# Patient Record
Sex: Female | Born: 1955 | Race: White | Hispanic: No | Smoking: Never smoker
Health system: Southern US, Community
[De-identification: ages and names within clinical notes are randomized; demographics above are authoritative.]

## PROBLEM LIST (undated history)

## (undated) DIAGNOSIS — N390 Urinary tract infection, site not specified: Secondary | ICD-10-CM

## (undated) DIAGNOSIS — F419 Anxiety disorder, unspecified: Secondary | ICD-10-CM

## (undated) DIAGNOSIS — F32A Depression, unspecified: Secondary | ICD-10-CM

## (undated) DIAGNOSIS — F329 Major depressive disorder, single episode, unspecified: Secondary | ICD-10-CM

## (undated) HISTORY — PX: HIATAL HERNIA REPAIR: SHX195

## (undated) HISTORY — PX: KNEE SURGERY: SHX244

## (undated) HISTORY — PX: FOOT SURGERY: SHX648

## (undated) HISTORY — PX: HERNIA REPAIR: SHX51

## (undated) HISTORY — PX: STOMACH SURGERY: SHX791

## (undated) HISTORY — PX: TONSILLECTOMY: SUR1361

---

## 2012-07-23 ENCOUNTER — Emergency Department (HOSPITAL_BASED_OUTPATIENT_CLINIC_OR_DEPARTMENT_OTHER)
Admission: EM | Admit: 2012-07-23 | Discharge: 2012-07-24 | Disposition: A | Discharge status: Home / Self Care | Payer: No Typology Code available for payment source | Attending: Emergency Medicine | Admitting: Emergency Medicine

## 2012-07-23 ENCOUNTER — Encounter (HOSPITAL_BASED_OUTPATIENT_CLINIC_OR_DEPARTMENT_OTHER): Payer: Self-pay | Admitting: *Deleted

## 2012-07-23 ENCOUNTER — Emergency Department (HOSPITAL_BASED_OUTPATIENT_CLINIC_OR_DEPARTMENT_OTHER): Payer: No Typology Code available for payment source

## 2012-07-23 DIAGNOSIS — N390 Urinary tract infection, site not specified: Secondary | ICD-10-CM | POA: Insufficient documentation

## 2012-07-23 DIAGNOSIS — F3289 Other specified depressive episodes: Secondary | ICD-10-CM | POA: Insufficient documentation

## 2012-07-23 DIAGNOSIS — F329 Major depressive disorder, single episode, unspecified: Secondary | ICD-10-CM | POA: Insufficient documentation

## 2012-07-23 DIAGNOSIS — F411 Generalized anxiety disorder: Secondary | ICD-10-CM | POA: Insufficient documentation

## 2012-07-23 DIAGNOSIS — R519 Headache, unspecified: Secondary | ICD-10-CM

## 2012-07-23 DIAGNOSIS — Z79899 Other long term (current) drug therapy: Secondary | ICD-10-CM | POA: Insufficient documentation

## 2012-07-23 DIAGNOSIS — R1310 Dysphagia, unspecified: Secondary | ICD-10-CM | POA: Insufficient documentation

## 2012-07-23 HISTORY — DX: Major depressive disorder, single episode, unspecified: F32.9

## 2012-07-23 HISTORY — DX: Depression, unspecified: F32.A

## 2012-07-23 HISTORY — DX: Anxiety disorder, unspecified: F41.9

## 2012-07-23 MED ORDER — SODIUM CHLORIDE 0.9 % IV SOLN
Freq: Once | INTRAVENOUS | Status: AC
  Administered 2012-07-24: 01:00:00 via INTRAVENOUS

## 2012-07-23 NOTE — ED Notes (Signed)
Family at bedside. 

## 2012-07-23 NOTE — ED Provider Notes (Addendum)
History     CSN: [id number]  Arrival date & time 07/23/12  2131   First MD Initiated Contact with Patient 07/23/12 2319      Chief Complaint  Patient presents with  . Headache    (Consider location/radiation/quality/duration/timing/severity/associated sxs/prior treatment) HPI This is a 56 year old female with a chief complaint of headache. She states a week ago she struck her head "pretty hard" against an object. She did not lose consciousness. For the past week she has been having trouble swallowing pills. She has been having to cut them in pieces to prevent her from gagging. She had not been having head pain following her injury until this morning. It began as mild pain in the occipital region which is worsened to about 8/10. The pain was well localized and worse with palpation but not with movement of the neck. It had been associated with worsening dysphagia and a subjective sensation of swelling in her throat. About 6 PM she developed a generalized tremor which lasted several hours. The tremor was severe enough to prevent her from performing simple tasks such as preparing dinner. The tremor has now resolved. She denies dysuria, fever, chest pain, dyspnea, nausea, vomiting, diarrhea or constipation.  Past Medical History  Diagnosis Date  . Anxiety   . Depression     Past Surgical History  Procedure Date  . Tonsillectomy   . Foot surgery   . Knee surgery     No family history on file.  History  Substance Use Topics  . Smoking status: Never Smoker   . Smokeless tobacco: Not on file  . Alcohol Use: No    OB History    Grav Para Term Preterm Abortions TAB SAB Ect Mult Living                  Review of Systems  All other systems reviewed and are negative.    Allergies  Codeine  Home Medications   Current Outpatient Rx  Name  Route  Sig  Dispense  Refill  . SERTRALINE HCL 50 MG PO TABS   Oral   Take 50 mg by mouth daily.         Marland Kitchen SIMVASTATIN 40 MG PO  TABS   Oral   Take 40 mg by mouth every evening.         . TRAZODONE HCL 100 MG PO TABS   Oral   Take 100 mg by mouth at bedtime.         . VENLAFAXINE HCL ER 150 MG PO CP24   Oral   Take 150 mg by mouth daily.           BP 129/89  Pulse 101  Temp 97.8 F (36.6 C) (Oral)  Resp 24  SpO2 96%  Physical Exam General: Well-developed, well-nourished female in no acute distress; appearance consistent with age of record HENT: normocephalic, atraumatic; no pharyngeal erythema, exudate or edema; no dysphonia; occipital scalp tenderness and about the origin of the greater occipital nerves Eyes: pupils equal round and reactive to light; extraocular muscles intact Neck: supple Heart: regular rate and rhythm Lungs: clear to auscultation bilaterally Abdomen: soft; nondistended; nontender; bowel sounds present Extremities: No deformity; full range of motion; pulses normal Neurologic: Awake, alert and oriented; motor function intact in all extremities and symmetric; no facial droop; normal coordination speech; negative Romberg; normal finger to nose Skin: Warm and dry Psychiatric: Flat affect    ED Course  Procedures (including critical care time)  MDM   Nursing notes and vitals signs, including pulse oximetry, reviewed.  Summary of this visit's results, reviewed by myself:  Labs:  Results for orders placed during the hospital encounter of 07/23/12  URINALYSIS, ROUTINE W REFLEX MICROSCOPIC      Component Value Range   Color, Urine YELLOW  YELLOW   APPearance CLOUDY (*) CLEAR   Specific Gravity, Urine 1.027  1.005 - 1.030   pH 6.5  5.0 - 8.0   Glucose, UA NEGATIVE  NEGATIVE mg/dL   Hgb urine dipstick NEGATIVE  NEGATIVE   Bilirubin Urine NEGATIVE  NEGATIVE   Ketones, ur NEGATIVE  NEGATIVE mg/dL   Protein, ur NEGATIVE  NEGATIVE mg/dL   Urobilinogen, UA 1.0  0.0 - 1.0 mg/dL   Nitrite NEGATIVE  NEGATIVE   Leukocytes, UA MODERATE (*) NEGATIVE  CBC WITH DIFFERENTIAL       Component Value Range   WBC 7.6  4.0 - 10.5 K/uL   RBC 4.28  3.87 - 5.11 MIL/uL   Hemoglobin 12.8  12.0 - 15.0 g/dL   HCT 16.1  09.6 - 04.5 %   MCV 90.4  78.0 - 100.0 fL   MCH 29.9  26.0 - 34.0 pg   MCHC 33.1  30.0 - 36.0 g/dL   RDW 40.9  81.1 - 91.4 %   Platelets 276  150 - 400 K/uL   Neutrophils Relative 62  43 - 77 %   Neutro Abs 4.7  1.7 - 7.7 K/uL   Lymphocytes Relative 28  12 - 46 %   Lymphs Abs 2.2  0.7 - 4.0 K/uL   Monocytes Relative 8  3 - 12 %   Monocytes Absolute 0.6  0.1 - 1.0 K/uL   Eosinophils Relative 1  0 - 5 %   Eosinophils Absolute 0.1  0.0 - 0.7 K/uL   Basophils Relative 0  0 - 1 %   Basophils Absolute 0.0  0.0 - 0.1 K/uL  BASIC METABOLIC PANEL      Component Value Range   Sodium 143  135 - 145 mEq/L   Potassium 3.8  3.5 - 5.1 mEq/L   Chloride 105  96 - 112 mEq/L   CO2 28  19 - 32 mEq/L   Glucose, Bld 104 (*) 70 - 99 mg/dL   BUN 17  6 - 23 mg/dL   Creatinine, Ser 7.82  0.50 - 1.10 mg/dL   Calcium 9.2  8.4 - 95.6 mg/dL   GFR calc non Af Amer 80 (*) >90 mL/min   GFR calc Af Amer >90  >90 mL/min  URINE MICROSCOPIC-ADD ON      Component Value Range   Squamous Epithelial / LPF FEW (*) RARE   WBC, UA 21-50  <3 WBC/hpf   RBC / HPF 0-2  <3 RBC/hpf   Bacteria, UA MANY (*) RARE   Urine-Other MUCOUS PRESENT      Imaging Studies: Ct Head Wo Contrast  07/24/2012  *RADIOLOGY REPORT*  Clinical Data: Patient struck the back of the head 1 week ago, persistent headache.  Bilateral hand tremors.  Dysphagia.  CT HEAD WITHOUT CONTRAST  Technique:  Contiguous axial images were obtained from the base of the skull through the vertex without contrast.  Comparison: None.  Findings: Ventricular system normal in size and appearance for age. No mass lesion.  No midline shift.  No acute hemorrhage or hematoma.  No extra-axial fluid collections.  No evidence of acute infarction.  No focal brain parenchymal abnormality.  Mucosal thickening  involving the right maxillary sinus.   Remaining visualized paranasal sinuses, bilateral mastoid air cells, and bilateral middle ear cavities well-aerated.  Mild bilateral carotid siphon atherosclerosis.  IMPRESSION:  1.  No acute intracranial abnormality. 2.  Mild chronic right maxillary sinusitis.   Original Report Authenticated By: Hulan Saas, M.D.    12:39 AM The patient passed her bedside swallowing study. She characterizes her dysphagia is a sensation something is getting stuck on the way down. Will arrange for an outpatient barium swallow which her primary care physician can followup on.  Her shaking episodes likely due to a urinary tract infection. We will treat her with antibiotics for this.  Her headache on exam is consistent with occipital neuralgia, likely a sequela of her recent occipital injury. She does not wish any prescriptions for analgesic.          Hanley Seamen, MD 07/24/12 0040  Hanley Seamen, MD 07/24/12 1610

## 2012-07-23 NOTE — ED Notes (Signed)
Pt. Reports she is unable to urinate and wants something to drink.  Explained that will have to be cleared by the EDP.

## 2012-07-23 NOTE — ED Notes (Signed)
Pain in the back of her head. Has been feeling shaky all day. States her throat is swelling. She is speaking in complete sentences. No respiratory distress at triage.

## 2012-07-24 LAB — URINALYSIS, ROUTINE W REFLEX MICROSCOPIC
Nitrite: NEGATIVE
Protein, ur: NEGATIVE mg/dL
Urobilinogen, UA: 1 mg/dL (ref 0.0–1.0)

## 2012-07-24 LAB — CBC WITH DIFFERENTIAL/PLATELET
Basophils Absolute: 0 10*3/uL (ref 0.0–0.1)
Basophils Relative: 0 % (ref 0–1)
Lymphocytes Relative: 28 % (ref 12–46)
MCHC: 33.1 g/dL (ref 30.0–36.0)
Neutro Abs: 4.7 10*3/uL (ref 1.7–7.7)
Platelets: 276 10*3/uL (ref 150–400)
RDW: 14.5 % (ref 11.5–15.5)
WBC: 7.6 10*3/uL (ref 4.0–10.5)

## 2012-07-24 LAB — URINE MICROSCOPIC-ADD ON

## 2012-07-24 LAB — BASIC METABOLIC PANEL
CO2: 28 mEq/L (ref 19–32)
Calcium: 9.2 mg/dL (ref 8.4–10.5)
Chloride: 105 mEq/L (ref 96–112)
GFR calc Af Amer: >90 mL/min (ref >90)
Sodium: 143 mEq/L (ref 135–145)

## 2012-07-24 MED ORDER — NITROFURANTOIN MONOHYD MACRO 100 MG PO CAPS
100.0000 mg | ORAL_CAPSULE | Freq: Once | ORAL | Status: AC
  Administered 2012-07-24: 100 mg via ORAL
  Filled 2012-07-24: qty 1

## 2012-07-24 MED ORDER — NITROFURANTOIN MONOHYD MACRO 100 MG PO CAPS: 100.0000 mg | ORAL_CAPSULE | Freq: Two times a day (BID) | ORAL | Status: AC

## 2012-07-24 NOTE — ED Notes (Signed)
Pt. Walked stedy gait to restroom and no trouble with urination.

## 2012-07-25 LAB — URINE CULTURE: Colony Count: NO GROWTH

## 2013-05-30 ENCOUNTER — Emergency Department (HOSPITAL_BASED_OUTPATIENT_CLINIC_OR_DEPARTMENT_OTHER): Payer: BC Managed Care – PPO

## 2013-05-30 ENCOUNTER — Encounter (HOSPITAL_BASED_OUTPATIENT_CLINIC_OR_DEPARTMENT_OTHER): Payer: Self-pay | Admitting: *Deleted

## 2013-05-30 ENCOUNTER — Emergency Department (HOSPITAL_BASED_OUTPATIENT_CLINIC_OR_DEPARTMENT_OTHER)
Admission: EM | Admit: 2013-05-30 | Discharge: 2013-05-30 | Payer: BC Managed Care – PPO | Attending: Emergency Medicine | Admitting: Emergency Medicine

## 2013-05-30 DIAGNOSIS — R109 Unspecified abdominal pain: Secondary | ICD-10-CM | POA: Insufficient documentation

## 2013-05-30 DIAGNOSIS — F329 Major depressive disorder, single episode, unspecified: Secondary | ICD-10-CM | POA: Insufficient documentation

## 2013-05-30 DIAGNOSIS — I209 Angina pectoris, unspecified: Secondary | ICD-10-CM | POA: Insufficient documentation

## 2013-05-30 DIAGNOSIS — R079 Chest pain, unspecified: Secondary | ICD-10-CM | POA: Insufficient documentation

## 2013-05-30 DIAGNOSIS — F3289 Other specified depressive episodes: Secondary | ICD-10-CM | POA: Insufficient documentation

## 2013-05-30 DIAGNOSIS — Z87440 Personal history of urinary (tract) infections: Secondary | ICD-10-CM | POA: Insufficient documentation

## 2013-05-30 DIAGNOSIS — R0602 Shortness of breath: Secondary | ICD-10-CM | POA: Insufficient documentation

## 2013-05-30 DIAGNOSIS — F411 Generalized anxiety disorder: Secondary | ICD-10-CM | POA: Insufficient documentation

## 2013-05-30 DIAGNOSIS — I200 Unstable angina: Secondary | ICD-10-CM

## 2013-05-30 DIAGNOSIS — Z79899 Other long term (current) drug therapy: Secondary | ICD-10-CM | POA: Insufficient documentation

## 2013-05-30 HISTORY — DX: Urinary tract infection, site not specified: N39.0

## 2013-05-30 LAB — URINALYSIS, ROUTINE W REFLEX MICROSCOPIC
Bilirubin Urine: NEGATIVE
Nitrite: NEGATIVE
Specific Gravity, Urine: 1.011 (ref 1.005–1.030)
Urobilinogen, UA: 0.2 mg/dL (ref 0.0–1.0)
pH: 6 (ref 5.0–8.0)

## 2013-05-30 LAB — BASIC METABOLIC PANEL
CO2: 26 mEq/L (ref 19–32)
Chloride: 104 mEq/L (ref 96–112)
Creatinine, Ser: 1 mg/dL (ref 0.50–1.10)
GFR calc Af Amer: 71 mL/min — ABNORMAL LOW (ref >90)
Potassium: 3.6 mEq/L (ref 3.5–5.1)

## 2013-05-30 LAB — CBC
MCV: 91.1 fL (ref 78.0–100.0)
Platelets: 264 10*3/uL (ref 150–400)
RBC: 4.48 MIL/uL (ref 3.87–5.11)
RDW: 14.1 % (ref 11.5–15.5)
WBC: 7.3 10*3/uL (ref 4.0–10.5)

## 2013-05-30 LAB — URINE MICROSCOPIC-ADD ON

## 2013-05-30 LAB — TROPONIN I: Troponin I: <0.3 ng/mL (ref <0.30)

## 2013-05-30 MED ORDER — NITROGLYCERIN 0.4 MG SL SUBL
0.4000 mg | SUBLINGUAL_TABLET | SUBLINGUAL | Status: AC | PRN
  Administered 2013-05-30 (×3): 0.4 mg via SUBLINGUAL
  Filled 2013-05-30: qty 25

## 2013-05-30 MED ORDER — NITROGLYCERIN 2 % TD OINT
1.0000 [in_us] | TOPICAL_OINTMENT | Freq: Once | TRANSDERMAL | Status: AC
  Administered 2013-05-30: 1 [in_us] via TOPICAL

## 2013-05-30 MED ORDER — ASPIRIN 325 MG PO TABS
325.0000 mg | ORAL_TABLET | Freq: Once | ORAL | Status: AC
  Administered 2013-05-30: 325 mg via ORAL
  Filled 2013-05-30: qty 1

## 2013-05-30 MED ORDER — NITROGLYCERIN 2 % TD OINT
TOPICAL_OINTMENT | TRANSDERMAL | Status: AC
  Administered 2013-05-30: 1 [in_us] via TOPICAL
  Filled 2013-05-30: qty 1

## 2013-05-30 MED ORDER — MORPHINE SULFATE 4 MG/ML IJ SOLN: 4.0000 mg | Freq: Once | INTRAMUSCULAR | Status: DC

## 2013-05-30 NOTE — ED Notes (Addendum)
Pt. States around 5pm she started having left jaw pain, teeth and left shoulder pain that radiated into her ant chest. Describes as "tight" and aching. States pain is constant. States she feels a little sob. 02 placed at 2l via n/c. Denies any n/v. Denies any heart history. Denies any diaphoresis . Also c/o of mid abd pain. States pain comes and goes. Denies fevers. Denies diarrhea. States pain is a stabbing type pain

## 2013-05-30 NOTE — ED Provider Notes (Signed)
CSN: [id number]     Arrival date & time 05/30/13  2019 History   This chart was scribed for Dagmar Hait, MD by Joaquin Music, ED Scribe. This patient was seen in room MH11/MH11 and the patient's care was started at 9:20 PM     Chief Complaint  Patient presents with  . Chest Pain   Patient is a 57 y.o. female presenting with chest pain. The history is provided by the patient. No language interpreter was used.  Chest Pain Pain location:  Substernal area Pain radiates to the back: no   Pain severity:  Moderate Timing:  Constant Progression:  Unchanged Chronicity:  New Context: at rest   Relieved by:  Nothing Exacerbated by: walking. Ineffective treatments:  None tried Associated symptoms: abdominal pain and shortness of breath   Associated symptoms: no headache, no nausea and not vomiting    HPI Comments: Brenda Morales is a 57 y.o. female who presents to the Emergency Department complaining of left jaw and left shoulder pain that progressed to substernal chest pain earlier this afternoon. She has associated SOB and mild abdominal pain. She rates her current chest pain as 5/10.  She states walking worsens the pain and indicated nothing makes it better.  Pt states she has never had this pain previously. Pt has not taken ASA or nitro PTA. Marland Kitchen Pt denies nausea, emesis,HA, blurred vision, leg swelling. She denies a personal or family cardiac history. She denies estrogen products. Pt denies smoking and alcohol use.Pt has history of a hernia and frequent UTI.  Past Medical History  Diagnosis Date  . Anxiety   . Depression   . UTI (lower urinary tract infection)    Past Surgical History  Procedure Laterality Date  . Tonsillectomy    . Foot surgery    . Knee surgery     No family history on file. History  Substance Use Topics  . Smoking status: Never Smoker   . Smokeless tobacco: Not on file  . Alcohol Use: No   OB History   Grav Para Term Preterm Abortions TAB  SAB Ect Mult Living                 Review of Systems  Eyes: Negative for visual disturbance.  Respiratory: Positive for shortness of breath.   Cardiovascular: Positive for chest pain. Negative for leg swelling.  Gastrointestinal: Positive for abdominal pain. Negative for nausea and vomiting.  Neurological: Negative for headaches.  All other systems reviewed and are negative.    Allergies  Codeine  Home Medications   Current Outpatient Rx  Name  Route  Sig  Dispense  Refill  . diazepam (VALIUM) 5 MG tablet   Oral   Take 5 mg by mouth 2 (two) times daily.         . nitrofurantoin, macrocrystal-monohydrate, (MACROBID) 100 MG capsule   Oral   Take 1 capsule (100 mg total) by mouth 2 (two) times daily.   14 capsule   0   . sertraline (ZOLOFT) 50 MG tablet   Oral   Take 50 mg by mouth daily.         . simvastatin (ZOCOR) 40 MG tablet   Oral   Take 40 mg by mouth every evening.         . traZODone (DESYREL) 100 MG tablet   Oral   Take 100 mg by mouth at bedtime.         Marland Kitchen venlafaxine XR (EFFEXOR-XR) 150 MG 24  hr capsule   Oral   Take 150 mg by mouth daily.          BP 146/89  Pulse 97  Temp(Src) 98 F (36.7 C) (Oral)  Resp 20  Ht 5' 6.5" (1.689 m)  Wt 198 lb (89.812 kg)  BMI 31.48 kg/m2  SpO2 100% Physical Exam  Nursing note and vitals reviewed. Constitutional: She is oriented to person, place, and time. She appears well-developed and well-nourished. No distress.  HENT:  Head: Normocephalic and atraumatic.  Eyes: EOM are normal.  Neck: Neck supple. No tracheal deviation present.  Cardiovascular: Normal rate, regular rhythm and normal heart sounds.   Pulmonary/Chest: Effort normal and breath sounds normal. No respiratory distress.  Abdominal: Soft. She exhibits no distension. There is no tenderness. There is no rebound and no guarding.  Musculoskeletal: Normal range of motion. She exhibits no edema.  Neurological: She is alert and oriented to  person, place, and time.  Skin: Skin is warm and dry.  Psychiatric: She has a normal mood and affect. Her behavior is normal.    ED Course  Procedures  DIAGNOSTIC STUDIES: Oxygen Saturation is 98% on RA, normal by my interpretation.    COORDINATION OF CARE: 9:29 PM-Discussed treatment plan  with pt at bedside and pt agreed to plan.   Labs Review Labs Reviewed  BASIC METABOLIC PANEL - Abnormal; Notable for the following:    Glucose, Bld 149 (*)    GFR calc non Af Amer 61 (*)    GFR calc Af Amer 71 (*)    All other components within normal limits  CBC  TROPONIN I  URINALYSIS, ROUTINE W REFLEX MICROSCOPIC   Imaging Review Dg Chest Port 1 View  05/30/2013   *RADIOLOGY REPORT*  Clinical Data: Chest pain  PORTABLE CHEST - 1 VIEW  Comparison: None.  Findings: Large hiatal hernia is noted.  Otherwise cardiomediastinal silhouette appears normal.  No acute pulmonary disease is noted.  No pleural effusion or pneumothorax is noted.  IMPRESSION: Large hiatal hernia.  No other abnormality seen in the chest.   Original Report Authenticated By: Lupita Raider.,  M.D.    Date: 05/30/2013  Rate: 93  Rhythm: normal sinus rhythm  QRS Axis: normal  Intervals: normal  ST/T Wave abnormalities: normal  Conduction Disutrbances:none  Narrative Interpretation:   Old EKG Reviewed: none available   MDM   1. Unstable angina   2. Chest pain    15F with no prior cardiac hx presents with CP. Persistent today, started as L jaw pain, then became L shoulder pain, then progressed to chest pain. No radiation. No prior pain like this before. Worse with exertion, associated SOB. No N/V/D. AFVSS here. Initial EKG normal. Exam benign. ASA given, pain decreased with NTG and paste put on when pain almost gone. Patient's initial troponin normal, stable for transfer to [facility]. Dr. Astrid Drafts with Cardiology will see patient tomorrow morning, Dr. Linard Millers will admit.   I personally performed the services described  in this documentation, which was scribed in my presence. The recorded information has been reviewed and is accurate.     Dagmar Hait, MD 05/30/13 2227

## 2013-05-30 NOTE — ED Notes (Signed)
I got vitals, helped patient into gown, I got ecg and gave to PA sophia. ECG results showed normal sinus rithym, normal ecg. Patient complains os left should pain, jaw and chest pain. I placed patient on nasal cannula at 2% 02.

## 2013-05-30 NOTE — ED Notes (Signed)
Assigned to bed 721 @ [facility] per nursing supervisor Boneta Lucks, RN notified, [facility] called for transport.

## 2013-05-30 NOTE — ED Notes (Signed)
Report called to [facility] RN room 721 [facility] bed is a ready bed

## 2014-06-17 ENCOUNTER — Emergency Department (HOSPITAL_BASED_OUTPATIENT_CLINIC_OR_DEPARTMENT_OTHER): Payer: BC Managed Care – PPO

## 2014-06-17 ENCOUNTER — Encounter (HOSPITAL_BASED_OUTPATIENT_CLINIC_OR_DEPARTMENT_OTHER): Payer: Self-pay | Admitting: Emergency Medicine

## 2014-06-17 ENCOUNTER — Emergency Department (HOSPITAL_BASED_OUTPATIENT_CLINIC_OR_DEPARTMENT_OTHER)
Admission: EM | Admit: 2014-06-17 | Discharge: 2014-06-17 | Disposition: A | Discharge status: Other Acute Inpatient Hospital | Payer: BC Managed Care – PPO | Attending: Emergency Medicine | Admitting: Emergency Medicine

## 2014-06-17 DIAGNOSIS — K311 Adult hypertrophic pyloric stenosis: Secondary | ICD-10-CM | POA: Diagnosis not present

## 2014-06-17 DIAGNOSIS — K562 Volvulus: Secondary | ICD-10-CM | POA: Insufficient documentation

## 2014-06-17 DIAGNOSIS — Z9889 Other specified postprocedural states: Secondary | ICD-10-CM | POA: Diagnosis not present

## 2014-06-17 DIAGNOSIS — R1013 Epigastric pain: Secondary | ICD-10-CM | POA: Diagnosis present

## 2014-06-17 DIAGNOSIS — Z87440 Personal history of urinary (tract) infections: Secondary | ICD-10-CM | POA: Diagnosis not present

## 2014-06-17 DIAGNOSIS — Z79899 Other long term (current) drug therapy: Secondary | ICD-10-CM | POA: Diagnosis not present

## 2014-06-17 DIAGNOSIS — F419 Anxiety disorder, unspecified: Secondary | ICD-10-CM | POA: Insufficient documentation

## 2014-06-17 DIAGNOSIS — F329 Major depressive disorder, single episode, unspecified: Secondary | ICD-10-CM | POA: Diagnosis not present

## 2014-06-17 DIAGNOSIS — K3189 Other diseases of stomach and duodenum: Secondary | ICD-10-CM

## 2014-06-17 DIAGNOSIS — I2699 Other pulmonary embolism without acute cor pulmonale: Secondary | ICD-10-CM | POA: Diagnosis not present

## 2014-06-17 LAB — CBC WITH DIFFERENTIAL/PLATELET
BASOS PCT: 0 % (ref 0–1)
Basophils Absolute: 0 10*3/uL (ref 0.0–0.1)
Eosinophils Absolute: 0 10*3/uL (ref 0.0–0.7)
Eosinophils Relative: 0 % (ref 0–5)
HCT: 42 % (ref 36.0–46.0)
HEMOGLOBIN: 13.7 g/dL (ref 12.0–15.0)
Lymphocytes Relative: 5 % — ABNORMAL LOW (ref 12–46)
Lymphs Abs: 0.7 10*3/uL (ref 0.7–4.0)
MCH: 28.3 pg (ref 26.0–34.0)
MCHC: 32.6 g/dL (ref 30.0–36.0)
MCV: 86.8 fL (ref 78.0–100.0)
MONO ABS: 0.4 10*3/uL (ref 0.1–1.0)
MONOS PCT: 3 % (ref 3–12)
NEUTROS ABS: 12.7 10*3/uL — AB (ref 1.7–7.7)
Neutrophils Relative %: 92 % — ABNORMAL HIGH (ref 43–77)
Platelets: 354 10*3/uL (ref 150–400)
RBC: 4.84 MIL/uL (ref 3.87–5.11)
RDW: 14.9 % (ref 11.5–15.5)
WBC: 13.8 10*3/uL — ABNORMAL HIGH (ref 4.0–10.5)

## 2014-06-17 LAB — COMPREHENSIVE METABOLIC PANEL
ALBUMIN: 3.8 g/dL (ref 3.5–5.2)
ALT: 20 U/L (ref 0–35)
ANION GAP: 16 — AB (ref 5–15)
AST: 17 U/L (ref 0–37)
Alkaline Phosphatase: 121 U/L — ABNORMAL HIGH (ref 39–117)
BUN: 13 mg/dL (ref 6–23)
CALCIUM: 9.5 mg/dL (ref 8.4–10.5)
CHLORIDE: 103 meq/L (ref 96–112)
CO2: 25 mEq/L (ref 19–32)
CREATININE: 0.7 mg/dL (ref 0.50–1.10)
GFR calc Af Amer: >90 mL/min (ref >90)
Glucose, Bld: 174 mg/dL — ABNORMAL HIGH (ref 70–99)
Potassium: 3.9 mEq/L (ref 3.7–5.3)
Sodium: 144 mEq/L (ref 137–147)
Total Bilirubin: 0.5 mg/dL (ref 0.3–1.2)
Total Protein: 8.1 g/dL (ref 6.0–8.3)

## 2014-06-17 LAB — LIPASE, BLOOD: LIPASE: 17 U/L (ref 11–59)

## 2014-06-17 LAB — I-STAT CG4 LACTIC ACID, ED: LACTIC ACID, VENOUS: 1.27 mmol/L (ref 0.5–2.2)

## 2014-06-17 MED ORDER — IOHEXOL 300 MG/ML  SOLN
100.0000 mL | Freq: Once | INTRAMUSCULAR | Status: AC | PRN
  Administered 2014-06-17: 100 mL via INTRAVENOUS

## 2014-06-17 MED ORDER — HYDROMORPHONE HCL 1 MG/ML IJ SOLN
0.5000 mg | Freq: Once | INTRAMUSCULAR | Status: AC
  Administered 2014-06-17: 0.5 mg via INTRAVENOUS
  Filled 2014-06-17: qty 1

## 2014-06-17 MED ORDER — SODIUM CHLORIDE 0.9 % IV BOLUS (SEPSIS)
1000.0000 mL | Freq: Once | INTRAVENOUS | Status: AC
  Administered 2014-06-17: 1000 mL via INTRAVENOUS

## 2014-06-17 MED ORDER — ONDANSETRON HCL 4 MG/2ML IJ SOLN
4.0000 mg | Freq: Once | INTRAMUSCULAR | Status: AC
  Administered 2014-06-17: 4 mg via INTRAVENOUS
  Filled 2014-06-17: qty 2

## 2014-06-17 MED ORDER — HEPARIN BOLUS VIA INFUSION
4000.0000 [IU] | Freq: Once | INTRAVENOUS | Status: AC
  Administered 2014-06-17: 4000 [IU] via INTRAVENOUS

## 2014-06-17 MED ORDER — LIDOCAINE HCL 2 % EX GEL
CUTANEOUS | Status: AC
  Administered 2014-06-17: 1
  Filled 2014-06-17: qty 20

## 2014-06-17 MED ORDER — HEPARIN (PORCINE) IN NACL 100-0.45 UNIT/ML-% IJ SOLN
1000.0000 [IU]/h | INTRAMUSCULAR | Status: DC
  Administered 2014-06-17: 1000 [IU]/h via INTRAVENOUS
  Filled 2014-06-17: qty 250

## 2014-06-17 MED ORDER — IOHEXOL 300 MG/ML  SOLN
50.0000 mL | Freq: Once | INTRAMUSCULAR | Status: AC | PRN
  Administered 2014-06-17: 50 mL via ORAL

## 2014-06-17 NOTE — ED Notes (Signed)
Pt reports to having hiatal hernia surgery in July 2015.  Pt now c/o upper abdominal pain and vomiting that has been ongoing for the past 2 days.  Denies any blood noted in vomit.  Pt appears tired upon triage.

## 2014-06-17 NOTE — ED Notes (Signed)
Pt had small amount of vomiting x 1 at sink.  MD made aware.

## 2014-06-17 NOTE — ED Notes (Signed)
Pt reports she is unable to take anymore sips of PO contrast d/t pain and fullness in abdomen.  MD Plunket made aware.

## 2014-06-17 NOTE — ED Provider Notes (Signed)
CSN: [id number]     Arrival date & time 06/17/14  0825 History   First MD Initiated Contact with Patient 06/17/14 [phone]     Chief Complaint  Patient presents with  . Abdominal Pain  . Emesis     (Consider location/radiation/quality/duration/timing/severity/associated sxs/prior Treatment) HPI Comments: Patient with a history of a large hiatal hernia with repair in July. She states the first day after surgery she had repetitive vomiting however since that time she has not had any vomiting or abdominal pain until yesterday. She has vomited approximately 20 times since yesterday and states it's yellow in nature. She denies any diarrhea and is passing flatus. She denies any history of gallbladder issues and states that at some point she had an ultrasound showing that her gallbladder was normal. She did not [facility] Dr. Carolynn SayersWescott because she was not sure if this was a problem with her surgery.  Patient is a 58 y.o. female presenting with abdominal pain and vomiting. The history is provided by the patient and the spouse.  Abdominal Pain Pain location:  Epigastric, LUQ and RUQ Pain quality: gnawing, sharp and shooting   Pain radiates to:  Does not radiate Pain severity:  Moderate Onset quality:  Gradual Duration:  1 day Timing:  Constant Progression:  Worsening Chronicity:  New Context: awakening from sleep   Relieved by:  Nothing Worsened by:  Eating Ineffective treatments:  None tried Associated symptoms: anorexia, nausea and vomiting   Associated symptoms: no chest pain, no constipation, no cough, no diarrhea, no dysuria, no fever, no hematemesis and no shortness of breath   Emesis Associated symptoms: abdominal pain   Associated symptoms: no diarrhea     Past Medical History  Diagnosis Date  . Anxiety   . Depression   . UTI (lower urinary tract infection)    Past Surgical History  Procedure Laterality Date  . Tonsillectomy    . Foot surgery    .  Knee surgery    . Hiatal hernia repair     History reviewed. No pertinent family history. History  Substance Use Topics  . Smoking status: Never Smoker   . Smokeless tobacco: Not on file  . Alcohol Use: No   OB History   Grav Para Term Preterm Abortions TAB SAB Ect Mult Living                 Review of Systems  Constitutional: Negative for fever.  Respiratory: Negative for cough and shortness of breath.   Cardiovascular: Negative for chest pain.  Gastrointestinal: Positive for nausea, vomiting, abdominal pain and anorexia. Negative for diarrhea, constipation and hematemesis.  Genitourinary: Negative for dysuria.  All other systems reviewed and are negative.     Allergies  Codeine  Home Medications   Prior to Admission medications   Medication Sig Start Date End Date Taking? Authorizing Provider  diazepam (VALIUM) 5 MG tablet Take 5 mg by mouth 2 (two) times daily.    Historical Provider, MD  nitrofurantoin, macrocrystal-monohydrate, (MACROBID) 100 MG capsule Take 1 capsule (100 mg total) by mouth 2 (two) times daily. 07/24/12   John L Molpus, MD  sertraline (ZOLOFT) 50 MG tablet Take 50 mg by mouth daily.    Historical Provider, MD  simvastatin (ZOCOR) 40 MG tablet Take 40 mg by mouth every evening.    Historical Provider, MD  traZODone (DESYREL) 100 MG tablet Take 100 mg by mouth at bedtime.    Historical Provider, MD  venlafaxine  XR (EFFEXOR-XR) 150 MG 24 hr capsule Take 150 mg by mouth daily.    Historical Provider, MD   BP 140/95  Pulse 93  Temp(Src) 97.8 F (36.6 C) (Oral)  Resp 18  Ht 5\' 6"  (1.676 m)  Wt 208 lb (94.348 kg)  BMI 33.59 kg/m2  SpO2 92% Physical Exam  Nursing note and vitals reviewed. Constitutional: She is oriented to person, place, and time. She appears well-developed and well-nourished. She appears distressed.  HENT:  Head: Normocephalic and atraumatic.  Mouth/Throat: Oropharynx is clear and moist. Mucous membranes are dry.  Eyes:  Conjunctivae and EOM are normal. Pupils are equal, round, and reactive to light.  Neck: Normal range of motion. Neck supple.  Cardiovascular: Normal rate, regular rhythm and intact distal pulses.   No murmur heard. Pulmonary/Chest: Effort normal and breath sounds normal. No respiratory distress. She has no wheezes. She has no rales.  Abdominal: Soft. She exhibits no distension. Bowel sounds are decreased. There is tenderness in the right upper quadrant, epigastric area and periumbilical area. There is guarding. There is no rebound.  Well healed surgical scars  Musculoskeletal: Normal range of motion. She exhibits no edema and no tenderness.  Neurological: She is alert and oriented to person, place, and time.  Skin: Skin is warm and dry. No rash noted. No erythema.  Psychiatric: She has a normal mood and affect. Her behavior is normal.    ED Course  Procedures (including critical care time) Labs Review Labs Reviewed  CBC WITH DIFFERENTIAL - Abnormal; Notable for the following:    WBC 13.8 (*)    Neutrophils Relative % 92 (*)    Neutro Abs 12.7 (*)    Lymphocytes Relative 5 (*)    All other components within normal limits  COMPREHENSIVE METABOLIC PANEL - Abnormal; Notable for the following:    Glucose, Bld 174 (*)    Alkaline Phosphatase 121 (*)    Anion gap 16 (*)    All other components within normal limits  LIPASE, BLOOD  URINALYSIS, ROUTINE W REFLEX MICROSCOPIC  I-STAT CG4 LACTIC ACID, ED    Imaging Review Ct Abdomen Pelvis W Contrast  06/17/2014   CLINICAL DATA:  Abdominal pain and vomiting. Hiatal hernia surgery 7/ 2015.  EXAM: CT ABDOMEN AND PELVIS WITH CONTRAST  TECHNIQUE: Multidetector CT imaging of the abdomen and pelvis was performed using the standard protocol following bolus administration of intravenous contrast.  CONTRAST:  50mL OMNIPAQUE IOHEXOL 300 MG/ML SOLN, OMNIPAQUE IOHEXOL 300 MG/ML SOLN  COMPARISON:  Abdominal series 10 10/2013.  FINDINGS: Innumerable  hepatic lucencies are noted. These measure up to 4 mm. These are too small fracture evaluation and although metastatic disease cannot be excluded, statistically they most likely represent represent tiny cysts. Spleen is normal. Pancreas is unremarkable. No biliary distention. Gallbladder is nondistended.  Adrenals are unremarkable. Punctate lucency noted in the right kidney, most likely tiny cyst statistically, too small for accurate evaluation. No evidence hydronephrosis or obstructing ureteral stone. The bladder is not distended. Uterus and adnexa unremarkable. Small amount of free pelvic fluid is present. Pelvic phleboliths.  No significant adenopathy. Abdominal aorta and its visceral branches are widely patent. The portal vein is patent.  Appendix normal. Small bowel and colon are nondistended. Hiatal hernia is present with herniation of the gastric antrum and proximal duodenum with partial volvulus is noted. This results in partial gastric obstruction. No free air. No mesenteric mass. No significant abdominal wall hernia.  Heart size normal. Pulmonary embolus noted in  a segmental branch right lower lobe. No acute bony abnormality.  IMPRESSION: 1. Pulmonary embolism in right lower pulmonary artery segmental branch. 2. 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 Surgical consultation suggested.  Critical Value/emergent results were called by telephone at the time of interpretation on 06/17/2014 at 11:45 am to ER nurse Amy , who verbally acknowledged these results, and is going to immediately informed Dr. Gwyneth Sprout.   Electronically Signed   By: Maisie Fus  Register   On: 06/17/2014 11:43   Dg Abd Acute W/chest  06/17/2014   CLINICAL DATA:  Pain and tenderness. Nausea and vomiting. Recent hiatal hernia surgery.  EXAM: ACUTE ABDOMEN SERIES (ABDOMEN 2 VIEW & CHEST 1 VIEW)  COMPARISON:  05/30/2013.  FINDINGS:  Mediastinum hilar structures normal. Cardiomegaly. Normal pulmonary vascularity. Subsegmental atelectasis and/or infiltrates of the lung bases.No pleural effusion or pneumothorax.  Hiatal hernia. Gastric distention containing fluid and/or debris. Followup abdominal series suggested to demonstrate resolution of distended stomach. No free air. Stool is present colon. Pelvic calcifications consistent with phleboliths.  IMPRESSION: 1. Bibasilar subsegmental atelectasis and/or infiltrates. 2. Prominent gastric distention. Findings suggesting hiatal hernia. Given the patient's history of recent hiatal hernia surgery follow-up abdominal series suggested to demonstrate resolution of gastric distention.   Electronically Signed   By: Maisie Fus  Register   On: 06/17/2014 09:59     EKG Interpretation None      MDM   Final diagnoses:  None    Patient presents with 36 hours of vomiting and upper abdominal pain. In July patient had a surgery for her hiatal hernia at [facility]. Since that time she has not had any vomiting or abdominal pain until yesterday. She has been unable to hold anything down. Pain is in the upper abdomen and sharp in nature. She has no peritoneal findings but decreased bowel sounds.  She denies any respiratory complaints but states after her surgery she did have hypoxia and required supplemental oxygen for approximately one week.  Concern for possible surgical complication versus bowel obstruction versus gallbladder pathology as the source of her pain. Lower suspicion for respiratory pathology. CBC with mild athetosis of 13.8 but otherwise normal. CMP, lipase, lactate are all within normal limits.  Acute abdominal series showed prominent gastric distention suggesting hiatal hernia and bilateral subsegmental atelectasis.  Will get a CT of abdomen and pelvis with contrast for further evaluation  12:02 PM CT showed gastric obstruction and volvulus.  Also saw a PE on CT.  Pt started on  heparin.  Spoke with Dr. Hyacinth Meeker and [facility].  Gwyneth Sprout, MD 06/17/14 1203

## 2014-06-17 NOTE — ED Notes (Addendum)
Pt reports post surgery she wore oxygen at home 3L [place] for low O2 sats (86%).  Pt reports oxygen was discontinued and pt is on room air now.  MD made aware.

## 2015-03-31 ENCOUNTER — Emergency Department (HOSPITAL_BASED_OUTPATIENT_CLINIC_OR_DEPARTMENT_OTHER)
Admission: EM | Admit: 2015-03-31 | Discharge: 2015-03-31 | Disposition: A | Discharge status: Home / Self Care | Payer: BLUE CROSS/BLUE SHIELD | Attending: Emergency Medicine | Admitting: Emergency Medicine

## 2015-03-31 ENCOUNTER — Encounter (HOSPITAL_BASED_OUTPATIENT_CLINIC_OR_DEPARTMENT_OTHER): Payer: Self-pay | Admitting: *Deleted

## 2015-03-31 ENCOUNTER — Emergency Department (HOSPITAL_BASED_OUTPATIENT_CLINIC_OR_DEPARTMENT_OTHER): Payer: BLUE CROSS/BLUE SHIELD

## 2015-03-31 DIAGNOSIS — R1013 Epigastric pain: Secondary | ICD-10-CM | POA: Diagnosis present

## 2015-03-31 DIAGNOSIS — Z79899 Other long term (current) drug therapy: Secondary | ICD-10-CM | POA: Diagnosis not present

## 2015-03-31 DIAGNOSIS — R112 Nausea with vomiting, unspecified: Secondary | ICD-10-CM | POA: Diagnosis not present

## 2015-03-31 DIAGNOSIS — Z9889 Other specified postprocedural states: Secondary | ICD-10-CM | POA: Insufficient documentation

## 2015-03-31 DIAGNOSIS — Z87440 Personal history of urinary (tract) infections: Secondary | ICD-10-CM | POA: Insufficient documentation

## 2015-03-31 DIAGNOSIS — F329 Major depressive disorder, single episode, unspecified: Secondary | ICD-10-CM | POA: Diagnosis not present

## 2015-03-31 DIAGNOSIS — R197 Diarrhea, unspecified: Secondary | ICD-10-CM | POA: Diagnosis not present

## 2015-03-31 DIAGNOSIS — R1011 Right upper quadrant pain: Secondary | ICD-10-CM | POA: Diagnosis not present

## 2015-03-31 DIAGNOSIS — F419 Anxiety disorder, unspecified: Secondary | ICD-10-CM | POA: Insufficient documentation

## 2015-03-31 DIAGNOSIS — R1010 Upper abdominal pain, unspecified: Secondary | ICD-10-CM

## 2015-03-31 LAB — COMPREHENSIVE METABOLIC PANEL
ALBUMIN: 3.8 g/dL (ref 3.5–5.0)
ALK PHOS: 104 U/L (ref 38–126)
ALT: 22 U/L (ref 14–54)
AST: 22 U/L (ref 15–41)
Anion gap: 7 (ref 5–15)
BUN: 11 mg/dL (ref 6–20)
CALCIUM: 8.8 mg/dL — AB (ref 8.9–10.3)
CO2: 26 mmol/L (ref 22–32)
Chloride: 107 mmol/L (ref 101–111)
Creatinine, Ser: 0.82 mg/dL (ref 0.44–1.00)
GFR calc Af Amer: >60 mL/min (ref >60)
GFR calc non Af Amer: >60 mL/min (ref >60)
Glucose, Bld: 137 mg/dL — ABNORMAL HIGH (ref 65–99)
Potassium: 3.4 mmol/L — ABNORMAL LOW (ref 3.5–5.1)
SODIUM: 140 mmol/L (ref 135–145)
Total Bilirubin: 0.3 mg/dL (ref 0.3–1.2)
Total Protein: 7.3 g/dL (ref 6.5–8.1)

## 2015-03-31 LAB — CBC WITH DIFFERENTIAL/PLATELET
BASOS ABS: 0 10*3/uL (ref 0.0–0.1)
Basophils Relative: 0 % (ref 0–1)
Eosinophils Absolute: 0.1 10*3/uL (ref 0.0–0.7)
Eosinophils Relative: 1 % (ref 0–5)
HEMATOCRIT: 41.6 % (ref 36.0–46.0)
HEMOGLOBIN: 13.4 g/dL (ref 12.0–15.0)
LYMPHS ABS: 1.8 10*3/uL (ref 0.7–4.0)
Lymphocytes Relative: 22 % (ref 12–46)
MCH: 27.6 pg (ref 26.0–34.0)
MCHC: 32.2 g/dL (ref 30.0–36.0)
MCV: 85.6 fL (ref 78.0–100.0)
MONO ABS: 0.6 10*3/uL (ref 0.1–1.0)
MONOS PCT: 7 % (ref 3–12)
NEUTROS PCT: 70 % (ref 43–77)
Neutro Abs: 5.6 10*3/uL (ref 1.7–7.7)
PLATELETS: 323 10*3/uL (ref 150–400)
RBC: 4.86 MIL/uL (ref 3.87–5.11)
RDW: 16.5 % — AB (ref 11.5–15.5)
WBC: 8 10*3/uL (ref 4.0–10.5)

## 2015-03-31 LAB — URINALYSIS, ROUTINE W REFLEX MICROSCOPIC
Bilirubin Urine: NEGATIVE
GLUCOSE, UA: NEGATIVE mg/dL
Hgb urine dipstick: NEGATIVE
Ketones, ur: NEGATIVE mg/dL
LEUKOCYTES UA: NEGATIVE
Nitrite: NEGATIVE
PROTEIN: NEGATIVE mg/dL
SPECIFIC GRAVITY, URINE: 1.004 — AB (ref 1.005–1.030)
Urobilinogen, UA: 0.2 mg/dL (ref 0.0–1.0)
pH: 6 (ref 5.0–8.0)

## 2015-03-31 LAB — LIPASE, BLOOD: LIPASE: 17 U/L — AB (ref 22–51)

## 2015-03-31 MED ORDER — SODIUM CHLORIDE 0.9 % IV BOLUS (SEPSIS)
1000.0000 mL | Freq: Once | INTRAVENOUS | Status: AC
  Administered 2015-03-31: 1000 mL via INTRAVENOUS

## 2015-03-31 MED ORDER — METHYLPREDNISOLONE SODIUM SUCC 125 MG IJ SOLR
INTRAMUSCULAR | Status: AC
  Filled 2015-03-31: qty 2

## 2015-03-31 MED ORDER — DIPHENHYDRAMINE HCL 50 MG/ML IJ SOLN
INTRAMUSCULAR | Status: AC
  Filled 2015-03-31: qty 1

## 2015-03-31 MED ORDER — DIPHENHYDRAMINE HCL 50 MG/ML IJ SOLN
25.0000 mg | Freq: Once | INTRAMUSCULAR | Status: AC
  Administered 2015-03-31: 25 mg via INTRAVENOUS

## 2015-03-31 MED ORDER — IOHEXOL 300 MG/ML  SOLN
100.0000 mL | Freq: Once | INTRAMUSCULAR | Status: AC | PRN
  Administered 2015-03-31: 100 mL via INTRAVENOUS

## 2015-03-31 MED ORDER — METHYLPREDNISOLONE SODIUM SUCC 125 MG IJ SOLR
125.0000 mg | Freq: Once | INTRAMUSCULAR | Status: AC
  Administered 2015-03-31: 125 mg via INTRAVENOUS

## 2015-03-31 MED ORDER — IOHEXOL 300 MG/ML  SOLN
25.0000 mL | Freq: Once | INTRAMUSCULAR | Status: AC | PRN
  Administered 2015-03-31: 25 mL via ORAL

## 2015-03-31 NOTE — ED Notes (Signed)
Abdominal pain into her right flank.

## 2015-03-31 NOTE — ED Notes (Signed)
Pt back from CT - CT tech at bedside, pt c/o mouth feeling swollen, hard to swallow, tongue felt numb, pt also reports having slurred speech - airway intact, no obvious s/s of edema noted to tongue, throat, lungs clear bilaterally, 98% O2 on Room Air. EDP [place] called to bedside.

## 2015-03-31 NOTE — Discharge Instructions (Signed)
Return to the emergency department if you develop worsening pain, high fever, bloody stool, or other new and concerning symptoms.   Abdominal Pain Many things can cause abdominal pain. Usually, abdominal pain is not caused by a disease and will improve without treatment. It can often be observed and treated at home. Your health care provider will do a physical exam and possibly order blood tests and X-rays to help determine the seriousness of your pain. However, in many cases, more time must pass before a clear cause of the pain can be found. Before that point, your health care provider may not know if you need more testing or further treatment. HOME CARE INSTRUCTIONS  Monitor your abdominal pain for any changes. The following actions may help to alleviate any discomfort you are experiencing:  Only take over-the-counter or prescription medicines as directed by your health care provider.  Do not take laxatives unless directed to do so by your health care provider.  Try a clear liquid diet (broth, tea, or water) as directed by your health care provider. Slowly move to a bland diet as tolerated. SEEK MEDICAL CARE IF:  You have unexplained abdominal pain.  You have abdominal pain associated with nausea or diarrhea.  You have pain when you urinate or have a bowel movement.  You experience abdominal pain that wakes you in the night.  You have abdominal pain that is worsened or improved by eating food.  You have abdominal pain that is worsened with eating fatty foods.  You have a fever. SEEK IMMEDIATE MEDICAL CARE IF:   Your pain does not go away within 2 hours.  You keep throwing up (vomiting).  Your pain is felt only in portions of the abdomen, such as the right side or the left lower portion of the abdomen.  You pass bloody or black tarry stools. MAKE SURE YOU:  Understand these instructions.   Will watch your condition.   Will get help right away if you are not doing well or  get worse.  Document Released: 06/12/2005 Document Revised: 09/07/2013 Document Reviewed: 05/12/2013 [facility] Patient Information 2015 [employer] [place]. This information is not intended to replace advice given to you by your health care provider. Make sure you discuss any questions you have with your health care provider.

## 2015-03-31 NOTE — ED Provider Notes (Signed)
CSN: [id number]     Arrival date & time 03/31/15  1531 History   First MD Initiated Contact with Patient 03/31/15 1601     Chief Complaint  Patient presents with  . Abdominal Pain     (Consider location/radiation/quality/duration/timing/severity/associated sxs/prior Treatment) HPI Comments: Patient is a 59 year old female with history of multiple prior abdominal surgeries including hiatal hernia repair, what sounds like intussusception, and G-tube placement with complications. She presents here today for evaluation of abdominal pain that started yesterday and is gradually worsening. Her pain is mainly in the epigastrium and right upper quadrant and not associated with any nausea, vomiting, diarrhea, or constipation. She denies any bloody stool. She denies any fevers or chills. Her pain is constant and unrelated to eating. She denies any prior history of gallbladder disease.  Patient is a 59 y.o. female presenting with abdominal pain. The history is provided by the patient.  Abdominal Pain Pain location:  Epigastric and RUQ Pain quality: cramping   Pain radiates to:  Does not radiate Pain severity:  Moderate Onset quality:  Gradual Duration:  2 days Timing:  Constant Progression:  Worsening Chronicity:  New Relieved by:  Nothing Worsened by:  Nothing tried Ineffective treatments:  None tried Associated symptoms: no chills, no constipation, no fever and no vomiting     Past Medical History  Diagnosis Date  . Anxiety   . Depression   . UTI (lower urinary tract infection)    Past Surgical History  Procedure Laterality Date  . Tonsillectomy    . Foot surgery    . Knee surgery    . Hiatal hernia repair    . Hernia repair    . Stomach surgery     No family history on file. History  Substance Use Topics  . Smoking status: Never Smoker   . Smokeless tobacco: Not on file  . Alcohol Use: No   OB History    No data available     Review of Systems  Constitutional: Negative  for fever and chills.  Gastrointestinal: Positive for abdominal pain. Negative for vomiting and constipation.  All other systems reviewed and are negative.     Allergies  Codeine  Home Medications   Prior to Admission medications   Medication Sig Start Date End Date Taking? Authorizing Provider  diazepam (VALIUM) 5 MG tablet Take 5 mg by mouth 2 (two) times daily.    Historical Provider, MD  nitrofurantoin, macrocrystal-monohydrate, (MACROBID) 100 MG capsule Take 1 capsule (100 mg total) by mouth 2 (two) times daily. 07/24/12   John Molpus, MD  sertraline (ZOLOFT) 50 MG tablet Take 50 mg by mouth daily.    Historical Provider, MD  simvastatin (ZOCOR) 40 MG tablet Take 40 mg by mouth every evening.    Historical Provider, MD  traZODone (DESYREL) 100 MG tablet Take 100 mg by mouth at bedtime.    Historical Provider, MD  venlafaxine XR (EFFEXOR-XR) 150 MG 24 hr capsule Take 150 mg by mouth daily.    Historical Provider, MD   BP 140/93 mmHg  Pulse 101  Temp(Src) 98.3 F (36.8 C) (Oral)  Resp 18  Ht 5\' 6"  (1.676 m)  Wt 208 lb (94.348 kg)  BMI 33.59 kg/m2  SpO2 98% Physical Exam  Constitutional: She is oriented to person, place, and time. She appears well-developed and well-nourished. No distress.  HENT:  Head: Normocephalic and atraumatic.  Neck: Normal range of motion. Neck supple.  Cardiovascular: Normal rate and regular rhythm.  Exam reveals no  gallop and no friction rub.   No murmur heard. Pulmonary/Chest: Effort normal and breath sounds normal. No respiratory distress. She has no wheezes.  Abdominal: Soft. Bowel sounds are normal. She exhibits no distension and no mass. There is tenderness. There is no rebound and no guarding.  There is tenderness to palpation in the right upper quadrant and epigastric region.  Musculoskeletal: Normal range of motion.  Neurological: She is alert and oriented to person, place, and time.  Skin: Skin is warm and dry. She is not diaphoretic.   Nursing note and vitals reviewed.   ED Course  Procedures (including critical care time) Labs Review Labs Reviewed  URINALYSIS, ROUTINE W REFLEX MICROSCOPIC (NOT AT [facility]) - Abnormal; Notable for the following:    Specific Gravity, Urine 1.004 (*)    All other components within normal limits  COMPREHENSIVE METABOLIC PANEL  LIPASE, BLOOD  CBC WITH DIFFERENTIAL/PLATELET    Imaging Review No results found.   EKG Interpretation None      MDM   Final diagnoses:  None    Patient presents with complaints of abdominal pain. She has a history of several prior abdominal surgeries and is concerned something may be wrong again. Her physical examination reveals only mild tenderness located in the epigastric region and right upper quadrant. She has no fever, no white count, normal electrolytes, LFTs, and lipase. Her urinalysis is also clear. CT scan of the abdomen and pelvis reveals no acute intra-abdominal pathology with normal-appearing gallbladder and no biliary dilatation. She appears very comfortable and in no distress. I'm uncertain as to the exact etiology of her symptoms, however nothing appears emergent.  She did have an apparent reaction to her contrast dye that was administered in radiology. She reported difficulty swallowing and feeling short of breath. This was short-lived and was treated with Solu-Medrol and IV Benadryl. She now feels back to baseline and I believe is appropriate for discharge.    Geoffery Lyons, MD 03/31/15 [phone]

## 2015-07-31 ENCOUNTER — Emergency Department (HOSPITAL_BASED_OUTPATIENT_CLINIC_OR_DEPARTMENT_OTHER)
Admission: EM | Admit: 2015-07-31 | Discharge: 2015-08-01 | Disposition: A | Discharge status: Home / Self Care | Payer: BLUE CROSS/BLUE SHIELD | Attending: Emergency Medicine | Admitting: Emergency Medicine

## 2015-07-31 ENCOUNTER — Encounter (HOSPITAL_BASED_OUTPATIENT_CLINIC_OR_DEPARTMENT_OTHER): Payer: Self-pay | Admitting: Emergency Medicine

## 2015-07-31 ENCOUNTER — Emergency Department (HOSPITAL_BASED_OUTPATIENT_CLINIC_OR_DEPARTMENT_OTHER): Payer: BLUE CROSS/BLUE SHIELD

## 2015-07-31 DIAGNOSIS — Z79899 Other long term (current) drug therapy: Secondary | ICD-10-CM | POA: Insufficient documentation

## 2015-07-31 DIAGNOSIS — F329 Major depressive disorder, single episode, unspecified: Secondary | ICD-10-CM | POA: Diagnosis not present

## 2015-07-31 DIAGNOSIS — F419 Anxiety disorder, unspecified: Secondary | ICD-10-CM | POA: Diagnosis not present

## 2015-07-31 DIAGNOSIS — R1012 Left upper quadrant pain: Secondary | ICD-10-CM | POA: Diagnosis present

## 2015-07-31 DIAGNOSIS — R1010 Upper abdominal pain, unspecified: Secondary | ICD-10-CM

## 2015-07-31 DIAGNOSIS — N390 Urinary tract infection, site not specified: Secondary | ICD-10-CM | POA: Diagnosis not present

## 2015-07-31 LAB — DIFFERENTIAL
BASOS PCT: 0 %
Basophils Absolute: 0 10*3/uL (ref 0.0–0.1)
Eosinophils Absolute: 0.2 10*3/uL (ref 0.0–0.7)
Eosinophils Relative: 3 %
LYMPHS PCT: 31 %
Lymphs Abs: 2.4 10*3/uL (ref 0.7–4.0)
MONO ABS: 0.6 10*3/uL (ref 0.1–1.0)
Monocytes Relative: 7 %
NEUTROS ABS: 4.6 10*3/uL (ref 1.7–7.7)
NEUTROS PCT: 59 %

## 2015-07-31 LAB — URINALYSIS, ROUTINE W REFLEX MICROSCOPIC
Bilirubin Urine: NEGATIVE
Glucose, UA: NEGATIVE mg/dL
Hgb urine dipstick: NEGATIVE
KETONES UR: NEGATIVE mg/dL
Nitrite: POSITIVE — AB
PH: 6.5 (ref 5.0–8.0)
PROTEIN: NEGATIVE mg/dL
SPECIFIC GRAVITY, URINE: 1.016 (ref 1.005–1.030)
Urobilinogen, UA: 0.2 mg/dL (ref 0.0–1.0)

## 2015-07-31 LAB — COMPREHENSIVE METABOLIC PANEL
ALT: 20 U/L (ref 14–54)
ANION GAP: 7 (ref 5–15)
AST: 18 U/L (ref 15–41)
Albumin: 3.9 g/dL (ref 3.5–5.0)
Alkaline Phosphatase: 116 U/L (ref 38–126)
BILIRUBIN TOTAL: 0.5 mg/dL (ref 0.3–1.2)
BUN: 16 mg/dL (ref 6–20)
CHLORIDE: 105 mmol/L (ref 101–111)
CO2: 28 mmol/L (ref 22–32)
Calcium: 8.8 mg/dL — ABNORMAL LOW (ref 8.9–10.3)
Creatinine, Ser: 0.77 mg/dL (ref 0.44–1.00)
GFR calc Af Amer: >60 mL/min (ref >60)
Glucose, Bld: 111 mg/dL — ABNORMAL HIGH (ref 65–99)
Potassium: 3.9 mmol/L (ref 3.5–5.1)
Sodium: 140 mmol/L (ref 135–145)
Total Protein: 7.1 g/dL (ref 6.5–8.1)

## 2015-07-31 LAB — CBC
HEMATOCRIT: 41.5 % (ref 36.0–46.0)
Hemoglobin: 13.3 g/dL (ref 12.0–15.0)
MCH: 28 pg (ref 26.0–34.0)
MCHC: 32 g/dL (ref 30.0–36.0)
MCV: 87.4 fL (ref 78.0–100.0)
Platelets: 301 10*3/uL (ref 150–400)
RBC: 4.75 MIL/uL (ref 3.87–5.11)
RDW: 15 % (ref 11.5–15.5)
WBC: 7.8 10*3/uL (ref 4.0–10.5)

## 2015-07-31 LAB — URINE MICROSCOPIC-ADD ON

## 2015-07-31 LAB — LIPASE, BLOOD: Lipase: 28 U/L (ref 11–51)

## 2015-07-31 NOTE — ED Notes (Signed)
Patient reports that she has an abdominal hernia and she had sharp pain that started at about 4 om. The patient reports that she is bloated and she can feel the hernia that she has.

## 2015-07-31 NOTE — ED Provider Notes (Signed)
CSN: [id number]     Arrival date & time 07/31/15  1949 History  By signing my name below, I, Budd Palmer, attest that this documentation has been prepared under the direction and in the presence of Loren Racer, MD. Electronically Signed: Budd Palmer, ED Scribe. 07/31/2015. 11:49 PM.    Chief Complaint  Patient presents with  . Abdominal Pain   The history is provided by the patient and the spouse. No language interpreter was used.   HPI Comments: Brenda Morales is a 59 y.o. female with a PMHx of UTI and a PSHx of hernia repair, hiatal hernia repair, and stomach surgery who presents to the Emergency Department complaining of left upper quadrant abdominal pain onset 7.5 hours ago. She states she was at work when the pain began. She notes the pain is right at the site of a previous stomach surgery. She's had ongoing episodic pain at the site since her surgery. She notes exacerbation of the pain with standing, and alleviation with lying supine. She states she has been taking tylenol for pain. She notes her last normal BM occurred this morning. She reports her primary physician diagnosed her with another hernia at the site of the current pain 1 week ago. She notes all of her past surgeries were done by [facility] and she states she does not want to go back to them. Per husband, the result of the surgery was that pt's stomach does not empty, and that pt was scheduled for another f/u appointment. Pt denies n/v and constipation.  Past Medical History  Diagnosis Date  . Anxiety   . Depression   . UTI (lower urinary tract infection)    Past Surgical History  Procedure Laterality Date  . Tonsillectomy    . Foot surgery    . Knee surgery    . Hiatal hernia repair    . Hernia repair    . Stomach surgery     History reviewed. No pertinent family history. Social History  Substance Use Topics  . Smoking status: Never Smoker   . Smokeless tobacco: None  . Alcohol Use: No   OB History    No  data available     Review of Systems  Constitutional: Negative for fever and chills.  Respiratory: Negative for shortness of breath.   Cardiovascular: Negative for chest pain.  Gastrointestinal: Positive for abdominal pain. Negative for nausea, vomiting, diarrhea and constipation.  Genitourinary: Positive for dysuria and frequency. Negative for hematuria and flank pain.  Musculoskeletal: Negative for myalgias, back pain, neck pain and neck stiffness.  Skin: Negative for rash and wound.  Neurological: Negative for dizziness, weakness, light-headedness, numbness and headaches.  All other systems reviewed and are negative.   Allergies  Omnipaque and Codeine  Home Medications   Prior to Admission medications   Medication Sig Start Date End Date Taking? Authorizing Provider  cephALEXin (KEFLEX) 500 MG capsule Take 1 capsule (500 mg total) by mouth 2 (two) times daily. 08/01/15   Loren Racer, MD  diazepam (VALIUM) 5 MG tablet Take 5 mg by mouth 2 (two) times daily.    Historical Provider, MD  metoCLOPramide (REGLAN) 10 MG tablet Take 1 tablet (10 mg total) by mouth every 6 (six) hours as needed for nausea or vomiting. 08/01/15   Loren Racer, MD  nitrofurantoin, macrocrystal-monohydrate, (MACROBID) 100 MG capsule Take 1 capsule (100 mg total) by mouth 2 (two) times daily. 07/24/12   John Molpus, MD  sertraline (ZOLOFT) 50 MG tablet Take 50 mg by  mouth daily.    Historical Provider, MD  simvastatin (ZOCOR) 40 MG tablet Take 40 mg by mouth every evening.    Historical Provider, MD  traMADol (ULTRAM) 50 MG tablet Take 1 tablet (50 mg total) by mouth every 6 (six) hours as needed for severe pain. 08/01/15   Loren Racer, MD  traZODone (DESYREL) 100 MG tablet Take 100 mg by mouth at bedtime.    Historical Provider, MD  venlafaxine XR (EFFEXOR-XR) 150 MG 24 hr capsule Take 150 mg by mouth daily.    Historical Provider, MD   BP 121/84 mmHg  Pulse 74  Temp(Src) 98.7 F (37.1 C) (Oral)   Resp 16  Ht  (1.676 m)  Wt 213 lb (96.616 kg)  BMI 34.40 kg/m2  SpO2 100% Physical Exam  Constitutional: She is oriented to person, place, and time. She appears well-developed and well-nourished. No distress.  HENT:  Head: Normocephalic and atraumatic.  Mouth/Throat: Oropharynx is clear and moist.  Eyes: EOM are normal. Pupils are equal, round, and reactive to light.  Neck: Normal range of motion. Neck supple.  Cardiovascular: Normal rate and regular rhythm.   Pulmonary/Chest: Effort normal and breath sounds normal. No respiratory distress. She has no wheezes. She has no rales.  Abdominal: Soft. Bowel sounds are normal. She exhibits no distension and no mass. There is no tenderness. There is no rebound and no guarding.  Abdominal exam is benign. No focal tenderness. Normal bowel sounds  Musculoskeletal: Normal range of motion. She exhibits no edema or tenderness.  No CVA tenderness bilaterally.  Neurological: She is alert and oriented to person, place, and time.  Skin: Skin is warm and dry. No rash noted. No erythema.  Psychiatric: She has a normal mood and affect. Her behavior is normal.  Nursing note and vitals reviewed.   ED Course  Procedures  DIAGNOSTIC STUDIES: Oxygen Saturation is 98% on RA, normal by my interpretation.    COORDINATION OF CARE: 11:33 PM - Discussed lab results of UTI. Discussed plans to order diagnostic imaging. Pt advised of plan for treatment and pt agrees.  Labs Review Labs Reviewed  COMPREHENSIVE METABOLIC PANEL - Abnormal; Notable for the following:    Glucose, Bld 111 (*)    Calcium 8.8 (*)    All other components within normal limits  URINALYSIS, ROUTINE W REFLEX MICROSCOPIC (NOT AT [facility]) - Abnormal; Notable for the following:    APPearance CLOUDY (*)    Nitrite POSITIVE (*)    Leukocytes, UA SMALL (*)    All other components within normal limits  URINE MICROSCOPIC-ADD ON - Abnormal; Notable for the following:    Bacteria, UA MANY (*)     All other components within normal limits  LIPASE, BLOOD  CBC  DIFFERENTIAL    Imaging Review Dg Abd Acute W/chest  07/31/2015  CLINICAL DATA:  Abdominal pain with bloating and shortness of breath since this evening. EXAM: DG ABDOMEN ACUTE W/ 1V CHEST COMPARISON:  CT 03/31/2015 FINDINGS: The cardiomediastinal contours are normal. Mild bibasilar atelectasis or scarring. The stomach is distended with ingested contents, small herniated portion in the lower thorax appear similar to prior CT. There is no free intra-abdominal air. Air-filled normal caliber small bowel loops in the lower abdomen without bowel distention. Small volume of stool throughout the colon. No radiopaque calculi. No acute osseous abnormalities are seen. IMPRESSION: 1. Has air-filled normal caliber small bowel loops in the lower abdomen in a nonobstructive pattern. Stomach distended with ingested contents, similar to prior  CT. 2. Bibasilar atelectasis or scarring. Electronically Signed   By: Rubye OaksMelanie  Ehinger M.D.   On: 07/31/2015 23:56   I have personally reviewed and evaluated these images and lab results as part of my medical decision-making.   EKG Interpretation None      MDM   Final diagnoses:  Upper abdominal pain  UTI (lower urinary tract infection)    I personally performed the services described in this documentation, which was scribed in my presence. The recorded information has been reviewed and is accurate.   Patient has a benign exam. She has normal vital signs. Laboratory workup is normal. Patient has appear to have a urinary tract infection. Abdominal series with nonobstructive pattern stomach is distended similar to previous CT. We'll start patient on Reglan for increased mobility and Keflex for UTI. She is advised to follow-up with her primary physician and [facility]. Return precautions have been given.  Loren Raceravid Feliciano Wynter, MD 08/01/15 [phone]

## 2015-07-31 NOTE — ED Notes (Signed)
Patient reports multiple abdominal surgeries over the past few months.  Reports these were [facility].  Reports today she began having left sided mid and upper abdominal pain.  Reports tenderness on palpation.  Denies N/V/D. States "my belly just hurts".

## 2015-08-01 MED ORDER — TRAMADOL HCL 50 MG PO TABS
50.0000 mg | ORAL_TABLET | Freq: Once | ORAL | Status: AC
  Administered 2015-08-01: 50 mg via ORAL
  Filled 2015-08-01: qty 1

## 2015-08-01 MED ORDER — CEPHALEXIN 500 MG PO CAPS: 500.0000 mg | ORAL_CAPSULE | Freq: Two times a day (BID) | ORAL | Status: AC

## 2015-08-01 MED ORDER — METOCLOPRAMIDE HCL 10 MG PO TABS: 10.0000 mg | ORAL_TABLET | Freq: Four times a day (QID) | ORAL | Status: AC | PRN

## 2015-08-01 MED ORDER — CEPHALEXIN 250 MG PO CAPS
500.0000 mg | ORAL_CAPSULE | Freq: Once | ORAL | Status: AC
  Administered 2015-08-01: 500 mg via ORAL
  Filled 2015-08-01: qty 2

## 2015-08-01 MED ORDER — METOCLOPRAMIDE HCL 10 MG PO TABS
10.0000 mg | ORAL_TABLET | Freq: Once | ORAL | Status: AC
  Administered 2015-08-01: 10 mg via ORAL
  Filled 2015-08-01: qty 1

## 2015-08-01 MED ORDER — TRAMADOL HCL 50 MG PO TABS: 50.0000 mg | ORAL_TABLET | Freq: Four times a day (QID) | ORAL | Status: DC | PRN

## 2015-08-01 NOTE — Discharge Instructions (Signed)
Abdominal Pain, Adult °Many things can cause abdominal pain. Usually, abdominal pain is not caused by a disease and will improve without treatment. It can often be observed and treated at home. Your health care provider will do a physical exam and possibly order blood tests and X-rays to help determine the seriousness of your pain. However, in many cases, more time must pass before a clear cause of the pain can be found. Before that point, your health care provider may not know if you need more testing or further treatment. °HOME CARE INSTRUCTIONS °Monitor your abdominal pain for any changes. The following actions may help to alleviate any discomfort you are experiencing: °· Only take over-the-counter or prescription medicines as directed by your health care provider. °· Do not take laxatives unless directed to do so by your health care provider. °· Try a clear liquid diet (broth, tea, or water) as directed by your health care provider. Slowly move to a bland diet as tolerated. °SEEK MEDICAL CARE IF: °· You have unexplained abdominal pain. °· You have abdominal pain associated with nausea or diarrhea. °· You have pain when you urinate or have a bowel movement. °· You experience abdominal pain that wakes you in the night. °· You have abdominal pain that is worsened or improved by eating food. °· You have abdominal pain that is worsened with eating fatty foods. °· You have a fever. °SEEK IMMEDIATE MEDICAL CARE IF: °· Your pain does not go away within 2 hours. °· You keep throwing up (vomiting). °· Your pain is felt only in portions of the abdomen, such as the right side or the left lower portion of the abdomen. °· You pass bloody or black tarry stools. °MAKE SURE YOU: °· Understand these instructions. °· Will watch your condition. °· Will get help right away if you are not doing well or get worse. °  °This information is not intended to replace advice given to you by your health care provider. Make sure you discuss  any questions you have with your health care provider. °  °Document Released: 06/12/2005 Document Revised: 05/24/2015 Document Reviewed: 05/12/2013 °Elsevier Interactive Patient Education ©2016 [employer]vier Inc[employer] °Urinary Tract Infection °Urinary tract infections (UTIs) can develop anywhere along your urinary tract. Your urinary tract is your body's drainage system for removing wastes and extra water. Your urinary tract includes two kidneys, two ureters, a bladder, and a urethra. Your kidneys are a pair of bean-shaped organs. Each kidney is about the size of your fist. They are located below your ribs, one on each side of your spine. °CAUSES °Infections are caused by microbes, which are microscopic organisms, including fungi, viruses, and bacteria. These organisms are so small that they can only be seen through a microscope. Bacteria are the microbes that most commonly cause UTIs. °SYMPTOMS  °Symptoms of UTIs may vary by age and gender of the patient and by the location of the infection. Symptoms in [place] women typically include a frequent and intense urge to urinate and a painful, burning feeling in the bladder or urethra during urination. Older women and men are more likely to be tired, shaky, and weak and have muscle aches and abdominal pain. A fever may mean the infection is in your kidneys. Other symptoms of a kidney infection include pain in your back or sides below the ribs, nausea, and vomiting. °DIAGNOSIS °To diagnose a UTI, your caregiver will ask you about your symptoms. Your caregiver will also ask you to provide a urine sample. The   urine sample will be tested for bacteria and white blood cells. White blood cells are made by your body to help fight infection. °TREATMENT  °Typically, UTIs can be treated with medication. Because most UTIs are caused by a bacterial infection, they usually can be treated with the use of antibiotics. The choice of antibiotic and length of treatment depend on your symptoms and the  type of bacteria causing your infection. °HOME CARE INSTRUCTIONS °· If you were prescribed antibiotics, take them exactly as your caregiver instructs you. Finish the medication even if you feel better after you have only taken some of the medication. °· Drink enough water and fluids to keep your urine clear or pale yellow. °· Avoid caffeine, tea, and carbonated beverages. They tend to irritate your bladder. °· Empty your bladder often. Avoid holding urine for long periods of time. °· Empty your bladder before and after sexual intercourse. °· After a bowel movement, women should cleanse from front to back. Use each tissue only once. °SEEK MEDICAL CARE IF:  °· You have back pain. °· You develop a fever. °· Your symptoms do not begin to resolve within 3 days. °SEEK IMMEDIATE MEDICAL CARE IF:  °· You have severe back pain or lower abdominal pain. °· You develop chills. °· You have nausea or vomiting. °· You have continued burning or discomfort with urination. °MAKE SURE YOU:  °· Understand these instructions. °· Will watch your condition. °· Will get help right away if you are not doing well or get worse. °  °This information is not intended to replace advice given to you by your health care provider. Make sure you discuss any questions you have with your health care provider. °  °Document Released: 06/12/2005 Document Revised: 05/24/2015 Document Reviewed: 10/11/2011 °Elsevier Interactive Patient Education ©2016 Elsevier Inc. ° °

## 2015-08-25 ENCOUNTER — Emergency Department (HOSPITAL_BASED_OUTPATIENT_CLINIC_OR_DEPARTMENT_OTHER): Payer: BLUE CROSS/BLUE SHIELD

## 2015-08-25 ENCOUNTER — Encounter (HOSPITAL_BASED_OUTPATIENT_CLINIC_OR_DEPARTMENT_OTHER): Payer: Self-pay | Admitting: Emergency Medicine

## 2015-08-25 ENCOUNTER — Ambulatory Visit (HOSPITAL_BASED_OUTPATIENT_CLINIC_OR_DEPARTMENT_OTHER)
Admit: 2015-08-25 | Discharge: 2015-08-25 | Disposition: A | Discharge status: Home / Self Care | Payer: BLUE CROSS/BLUE SHIELD | Attending: Emergency Medicine | Admitting: Emergency Medicine

## 2015-08-25 ENCOUNTER — Emergency Department (HOSPITAL_BASED_OUTPATIENT_CLINIC_OR_DEPARTMENT_OTHER)
Admission: EM | Admit: 2015-08-25 | Discharge: 2015-08-25 | Disposition: A | Discharge status: Home / Self Care | Payer: BLUE CROSS/BLUE SHIELD | Attending: Emergency Medicine | Admitting: Emergency Medicine

## 2015-08-25 DIAGNOSIS — R740 Nonspecific elevation of levels of transaminase and lactic acid dehydrogenase [LDH]: Secondary | ICD-10-CM | POA: Diagnosis not present

## 2015-08-25 DIAGNOSIS — F419 Anxiety disorder, unspecified: Secondary | ICD-10-CM | POA: Diagnosis not present

## 2015-08-25 DIAGNOSIS — R7401 Elevation of levels of liver transaminase levels: Secondary | ICD-10-CM

## 2015-08-25 DIAGNOSIS — Z79899 Other long term (current) drug therapy: Secondary | ICD-10-CM | POA: Insufficient documentation

## 2015-08-25 DIAGNOSIS — K8050 Calculus of bile duct without cholangitis or cholecystitis without obstruction: Secondary | ICD-10-CM | POA: Diagnosis not present

## 2015-08-25 DIAGNOSIS — R1011 Right upper quadrant pain: Secondary | ICD-10-CM | POA: Diagnosis present

## 2015-08-25 DIAGNOSIS — Z87440 Personal history of urinary (tract) infections: Secondary | ICD-10-CM | POA: Insufficient documentation

## 2015-08-25 DIAGNOSIS — E663 Overweight: Secondary | ICD-10-CM | POA: Insufficient documentation

## 2015-08-25 DIAGNOSIS — Z792 Long term (current) use of antibiotics: Secondary | ICD-10-CM | POA: Diagnosis not present

## 2015-08-25 DIAGNOSIS — F329 Major depressive disorder, single episode, unspecified: Secondary | ICD-10-CM | POA: Insufficient documentation

## 2015-08-25 DIAGNOSIS — R109 Unspecified abdominal pain: Secondary | ICD-10-CM

## 2015-08-25 LAB — CBC WITH DIFFERENTIAL/PLATELET
BASOS ABS: 0 10*3/uL (ref 0.0–0.1)
BASOS PCT: 0 %
EOS ABS: 0.1 10*3/uL (ref 0.0–0.7)
EOS PCT: 1 %
HCT: 41.5 % (ref 36.0–46.0)
HEMOGLOBIN: 13.3 g/dL (ref 12.0–15.0)
LYMPHS ABS: 1.3 10*3/uL (ref 0.7–4.0)
Lymphocytes Relative: 14 %
MCH: 28 pg (ref 26.0–34.0)
MCHC: 32 g/dL (ref 30.0–36.0)
MCV: 87.4 fL (ref 78.0–100.0)
Monocytes Absolute: 0.7 10*3/uL (ref 0.1–1.0)
Monocytes Relative: 7 %
NEUTROS PCT: 78 %
Neutro Abs: 7 10*3/uL (ref 1.7–7.7)
PLATELETS: 287 10*3/uL (ref 150–400)
RBC: 4.75 MIL/uL (ref 3.87–5.11)
RDW: 14.9 % (ref 11.5–15.5)
WBC: 9.1 10*3/uL (ref 4.0–10.5)

## 2015-08-25 LAB — URINALYSIS, ROUTINE W REFLEX MICROSCOPIC
BILIRUBIN URINE: NEGATIVE
GLUCOSE, UA: NEGATIVE mg/dL
HGB URINE DIPSTICK: NEGATIVE
Ketones, ur: NEGATIVE mg/dL
Leukocytes, UA: NEGATIVE
Nitrite: NEGATIVE
PROTEIN: NEGATIVE mg/dL
Specific Gravity, Urine: 1.025 (ref 1.005–1.030)
pH: 5.5 (ref 5.0–8.0)

## 2015-08-25 LAB — COMPREHENSIVE METABOLIC PANEL
ALBUMIN: 3.7 g/dL (ref 3.5–5.0)
ALK PHOS: 125 U/L (ref 38–126)
ALT: 64 U/L — AB (ref 14–54)
ANION GAP: 8 (ref 5–15)
AST: 110 U/L — AB (ref 15–41)
BUN: 17 mg/dL (ref 6–20)
CALCIUM: 8.5 mg/dL — AB (ref 8.9–10.3)
CO2: 25 mmol/L (ref 22–32)
CREATININE: 0.84 mg/dL (ref 0.44–1.00)
Chloride: 108 mmol/L (ref 101–111)
Glucose, Bld: 143 mg/dL — ABNORMAL HIGH (ref 65–99)
Potassium: 3.9 mmol/L (ref 3.5–5.1)
SODIUM: 141 mmol/L (ref 135–145)
TOTAL PROTEIN: 6.8 g/dL (ref 6.5–8.1)
Total Bilirubin: 0.8 mg/dL (ref 0.3–1.2)

## 2015-08-25 LAB — I-STAT CG4 LACTIC ACID, ED: LACTIC ACID, VENOUS: 1.89 mmol/L (ref 0.5–2.0)

## 2015-08-25 LAB — LIPASE, BLOOD: LIPASE: 39 U/L (ref 11–51)

## 2015-08-25 MED ORDER — MORPHINE SULFATE (PF) 4 MG/ML IV SOLN
4.0000 mg | Freq: Once | INTRAVENOUS | Status: AC
  Administered 2015-08-25: 4 mg via INTRAVENOUS
  Filled 2015-08-25: qty 1

## 2015-08-25 MED ORDER — TRAMADOL HCL 50 MG PO TABS
50.0000 mg | ORAL_TABLET | Freq: Four times a day (QID) | ORAL | Status: AC | PRN
Start: 2015-08-25 — End: ?

## 2015-08-25 MED ORDER — ONDANSETRON 4 MG PO TBDP: 4.0000 mg | ORAL_TABLET | Freq: Three times a day (TID) | ORAL | Status: AC | PRN

## 2015-08-25 MED ORDER — SODIUM CHLORIDE 0.9 % IV BOLUS (SEPSIS)
1000.0000 mL | Freq: Once | INTRAVENOUS | Status: AC
  Administered 2015-08-25: 1000 mL via INTRAVENOUS

## 2015-08-25 MED ORDER — ONDANSETRON HCL 4 MG/2ML IJ SOLN
4.0000 mg | Freq: Once | INTRAMUSCULAR | Status: AC
Start: 2015-08-25 — End: 2015-08-25
  Administered 2015-08-25: 4 mg via INTRAVENOUS
  Filled 2015-08-25: qty 2

## 2015-08-25 NOTE — ED Provider Notes (Signed)
CSN: [id number]     Arrival date & time 08/25/15  4098 History   First MD Initiated Contact with Patient 08/25/15 0351     Chief Complaint  Patient presents with  . Abdominal Pain     (Consider location/radiation/quality/duration/timing/severity/associated sxs/prior Treatment) HPI   This is a 59 year old female with complicated past medical history including hiatal hernia, hiatal hernia repair, gastric volvulus, cholelithiasis who presents with abdominal pain. Onset of pain 10 PM. Reports the sharp in the right upper quadrant. It does not radiate. Currently her pain is "11 out of 10." Nothing makes the pain better or worse. She has not had pain like this in the past. Denies fever or recent illnesses. Reports nonbilious, nonbloody emesis. Denies diarrhea. Last normal bowel movement was yesterday. Denies any urinary frequency or urgency. Denies any chest pain or shortness of breath. Past Medical History  Diagnosis Date  . Anxiety   . Depression   . UTI (lower urinary tract infection)    Past Surgical History  Procedure Laterality Date  . Tonsillectomy    . Foot surgery    . Knee surgery    . Hiatal hernia repair    . Hernia repair    . Stomach surgery     History reviewed. No pertinent family history. Social History  Substance Use Topics  . Smoking status: Never Smoker   . Smokeless tobacco: None  . Alcohol Use: No   OB History    No data available     Review of Systems  Constitutional: Negative for fever.  Respiratory: Negative for cough, chest tightness and shortness of breath.   Cardiovascular: Negative for chest pain.  Gastrointestinal: Positive for nausea, vomiting and abdominal pain. Negative for diarrhea and constipation.  Genitourinary: Negative for dysuria.  Neurological: Negative for headaches.  All other systems reviewed and are negative.     Allergies  Omnipaque and Codeine  Home Medications   Prior to Admission medications   Medication Sig Start  Date End Date Taking? Authorizing Provider  cephALEXin (KEFLEX) 500 MG capsule Take 1 capsule (500 mg total) by mouth 2 (two) times daily. 08/01/15   Loren Racer, MD  diazepam (VALIUM) 5 MG tablet Take 5 mg by mouth 2 (two) times daily.    Historical Provider, MD  metoCLOPramide (REGLAN) 10 MG tablet Take 1 tablet (10 mg total) by mouth every 6 (six) hours as needed for nausea or vomiting. 08/01/15   Loren Racer, MD  nitrofurantoin, macrocrystal-monohydrate, (MACROBID) 100 MG capsule Take 1 capsule (100 mg total) by mouth 2 (two) times daily. 07/24/12   John Molpus, MD  ondansetron (ZOFRAN ODT) 4 MG disintegrating tablet Take 1 tablet (4 mg total) by mouth every 8 (eight) hours as needed for nausea or vomiting. 08/25/15   Shon Baton, MD  sertraline (ZOLOFT) 50 MG tablet Take 50 mg by mouth daily.    Historical Provider, MD  simvastatin (ZOCOR) 40 MG tablet Take 40 mg by mouth every evening.    Historical Provider, MD  traMADol (ULTRAM) 50 MG tablet Take 1 tablet (50 mg total) by mouth every 6 (six) hours as needed. 08/25/15   Shon Baton, MD  traZODone (DESYREL) 100 MG tablet Take 100 mg by mouth at bedtime.    Historical Provider, MD  venlafaxine XR (EFFEXOR-XR) 150 MG 24 hr capsule Take 150 mg by mouth daily.    Historical Provider, MD   BP 137/90 mmHg  Pulse 86  Temp(Src) 97.9 F (36.6 C) (Oral)  Resp  18  Ht 5\' 6"  (1.676 m)  Wt 200 lb (90.719 kg)  BMI 32.30 kg/m2  SpO2 96% Physical Exam  Constitutional: She is oriented to person, place, and time. She appears well-developed and well-nourished. No distress.  Overweight  HENT:  Head: Normocephalic and atraumatic.  Cardiovascular: Normal rate, regular rhythm and normal heart sounds.   No murmur heard. Pulmonary/Chest: Effort normal and breath sounds normal. No respiratory distress. She has no wheezes.  Abdominal: Soft. Bowel sounds are normal. There is no rebound and no guarding.  Subjective tenderness to palpation of  the epigastrium and right upper and mid abdomen, there is no rebound or guarding, patient does not wince with palpation  Neurological: She is alert and oriented to person, place, and time.  Skin: Skin is warm and dry.  Psychiatric: She has a normal mood and affect.  Nursing note and vitals reviewed.   ED Course  Procedures (including critical care time) Labs Review Labs Reviewed  COMPREHENSIVE METABOLIC PANEL - Abnormal; Notable for the following:    Glucose, Bld 143 (*)    Calcium 8.5 (*)    AST 110 (*)    ALT 64 (*)    All other components within normal limits  CBC WITH DIFFERENTIAL/PLATELET  LIPASE, BLOOD  URINALYSIS, ROUTINE W REFLEX MICROSCOPIC (NOT AT [facility])  I-STAT CG4 LACTIC ACID, ED    Imaging Review Ct Abdomen Pelvis Wo Contrast  08/25/2015  CLINICAL DATA:  59 year old female with right upper quadrant abdominal pain. History of hiatal hernia repair. EXAM: CT ABDOMEN AND PELVIS WITHOUT CONTRAST TECHNIQUE: Multidetector CT imaging of the abdomen and pelvis was performed following the standard protocol without IV contrast. COMPARISON:  CT dated 03/31/2015 FINDINGS: Evaluation of this exam is limited in the absence of intravenous contrast. Minimal bibasilar linear atelectasis/scarring. No focal consolidation. No intra-abdominal free air or free fluid. Subcentimeter scattered hepatic hypodensities are not characterized on this noncontrast CT. There is mild distention of the gallbladder. Sludge or small stones may be present within the gallbladder. No significant pericholecystic fluid. Ultrasound is recommended for better evaluation of the gallbladder. The pancreas, spleen, and the left adrenal gland appear unremarkable. A 1.2 cm right adrenal adenoma. The kidneys, visualized ureters, and urinary bladder appear unremarkable. The uterus is anteverted appears grossly unremarkable. This postsurgical changes of Nissen fundoplication with stable appearing defect in the diaphragmatic hiatus and  mild protrusion of the stomach similar to the prior study. Constipation. No evidence of bowel obstruction or inflammation. Normal appendix. The abdominal aorta and IVC appear unremarkable on this noncontrast study. No portal venous gas identified. There is no lymphadenopathy. There is a small fat containing supraumbilical hernia. There is no inflammatory changes of the herniated fat to suggest strangulation/inflammation. There is mild degenerative changes of the spine. No acute fracture. IMPRESSION: Mild distention of the gallbladder with possible sludge. Ultrasound is recommended for better evaluation of the gallbladder. Postsurgical changes of hiatal hernia repair with stable appearing mild recurrence. Constipation. No evidence of bowel obstruction or inflammation. Normal appendix. No hydronephrosis or nephrolithiasis. Electronically Signed   By: Elgie CollardArash  Radparvar M.D.   On: 08/25/2015 05:45   Dg Abd Acute W/chest  08/25/2015  CLINICAL DATA:  59 year old female with right upper quadrant abdominal pain nausea vomiting. EXAM: DG ABDOMEN ACUTE W/ 1V CHEST COMPARISON:  Radiograph dated 07/31/15 FINDINGS: There are minimal bibasilar dependent atelectatic changes. No focal consolidation, pleural effusion, or pneumothorax. The cardiac silhouette is within normal limits. There is moderate stool throughout the colon. There is  no evidence of bowel obstruction. No free air. No radiopaque calculi are foreign object identified. The osseous structures are grossly unremarkable. IMPRESSION: Constipation.  No bowel obstruction. No acute cardiopulmonary process. Electronically Signed   By: Elgie Collard M.D.   On: 08/25/2015 04:20   I have personally reviewed and evaluated these images and lab results as part of my medical decision-making.   EKG Interpretation None      MDM   Final diagnoses:  Abdominal pain  Biliary colic  Transaminitis   Patient presents with abdominal pain and vomiting. Complicated past  medical history including hiatal hernia, hernia repair, gastric volvulus. Also reports history of cholelithiasis. Pain is in the right upper quadrant.  No signs of peritonitis. Patient given fluids, pain, and nausea medication.  4:43 AM On recheck, patient more comfortable after pain and nausea medication. Lab work is notable for a mild increase in LFTs but otherwise reassuring. No leukocytosis. Lactate is normal. KUB shows constipation but no evidence of obstruction. Discussed with patient given location of pain and mild elevation of LFTs, suspect that this may be her gallstones. Given that she is feeling better I feel it would be reasonable for her to come back for ultrasound later this morning as I do not have ultrasound capability here. The other option is to do a CT scan which is less ideal imaging for the gallbladder but which show any significant abnormalities. Patient states that she really "wants to get to the bottom of this." She reports that she was here 3 weeks ago.  Patient has elected to have a CT scan. This would also rule out any further complications with her known prior surgeries.  6:02 AM Patient is resting comfortably. CT scan limited because of patient's inability to take IV contrast. However there is some evidence of sludge in the gallbladder. Also evidence of constipation. Given location of pain, suspect biliary colic. At this time patient is improved. Suspect biliary colic. Will have patient return for ultrasound later this morning. Patient is agreeable to plan.  After history, exam, and medical workup I feel the patient has been appropriately medically screened and is safe for discharge home. Pertinent diagnoses were discussed with the patient. Patient was given return precautions.     Shon Baton, MD 08/25/15 [phone]

## 2015-08-25 NOTE — ED Notes (Signed)
Rt upper abd pain w vomiting onset last pm

## 2015-08-25 NOTE — ED Notes (Signed)
Patient transported to CT 

## 2015-08-25 NOTE — Discharge Instructions (Signed)
Biliary Colic °Biliary colic is a pain in the upper abdomen. The pain: °· Is usually felt on the right side of the abdomen, but it may also be felt in the center of the abdomen, just below the breastbone (sternum). °· May spread back toward the right shoulder blade. °· May be steady or irregular. °· May be accompanied by nausea and vomiting. °Most of the time, the pain goes away in 1-5 hours. After the most intense pain passes, the abdomen may continue to ache mildly for about 24 hours. °Biliary colic is caused by a blockage in the bile duct. The bile duct is a pathway that carries bile--a liquid that helps to digest fats--from the gallbladder to the small intestine. Biliary colic usually occurs after eating, when the digestive system demands bile. The pain develops when muscle cells contract forcefully to try to move the blockage so that bile can get by. °HOME CARE INSTRUCTIONS °· Take medicines only as directed by your health care provider. °· Drink enough fluid to keep your urine clear or pale yellow. °· Avoid fatty, greasy, and fried foods. These kinds of foods increase your body's demand for bile. °· Avoid any foods that make your pain worse. °· Avoid overeating. °· Avoid having a large meal after fasting. °SEEK MEDICAL CARE IF: °· You develop a fever. °· Your pain gets worse. °· You vomit. °· You develop nausea that prevents you from eating and drinking. °SEEK IMMEDIATE MEDICAL CARE IF: °· You suddenly develop a fever and shaking chills. °· You develop a yellowish discoloration (jaundice) of: °¨ Skin. °¨ Whites of the eyes. °¨ Mucous membranes. °· You have continuous or severe pain that is not relieved with medicines. °· You have nausea and vomiting that is not relieved with medicines. °· You develop dizziness or you faint. °  °This information is not intended to replace advice given to you by your health care provider. Make sure you discuss any questions you have with your health care provider. °  °Document  Released: 02/03/2006 Document Revised: 01/17/2015 Document Reviewed: 06/14/2014 °[employer]vier Interactive Patient Education ©2016 [employer] ° °

## 2015-08-25 NOTE — ED Notes (Signed)
Pt reports pain onset last PM denies diarrhea but admit to 5 emesis, hx gallstones and abd hernia

## 2015-08-25 NOTE — ED Provider Notes (Signed)
Patient with outpatient ultrasound performed this morning that demonstrates acute cholecystitis.  [facility] is [facility] given her prior surgical history there. Discuss[facility] the patient with [facility]. He requests the patient be sent to [facility] for further evaluation. Discussed with the patient to present directly to the emergency department and to not have anything to eat or drink until repeat evaluation.  Tilden FossaElizabeth Shonette Rhames, MD 08/25/15 [phone]

## 2016-03-18 ENCOUNTER — Other Ambulatory Visit: Payer: Self-pay | Admitting: Surgical Oncology

## 2016-03-18 DIAGNOSIS — N6310 Unspecified lump in the right breast, unspecified quadrant: Secondary | ICD-10-CM

## 2016-03-25 ENCOUNTER — Ambulatory Visit
Admission: RE | Admit: 2016-03-25 | Discharge: 2016-03-25 | Disposition: A | Discharge status: Home / Self Care | Payer: BLUE CROSS/BLUE SHIELD | Source: Ambulatory Visit | Attending: Surgical Oncology | Admitting: Surgical Oncology

## 2016-03-25 DIAGNOSIS — N6310 Unspecified lump in the right breast, unspecified quadrant: Secondary | ICD-10-CM

## 2017-01-02 ENCOUNTER — Other Ambulatory Visit: Payer: Self-pay | Admitting: Radiation Oncology

## 2017-01-02 DIAGNOSIS — R928 Other abnormal and inconclusive findings on diagnostic imaging of breast: Secondary | ICD-10-CM

## 2017-01-09 ENCOUNTER — Ambulatory Visit
Admission: RE | Admit: 2017-01-09 | Discharge: 2017-01-09 | Disposition: A | Discharge status: Home / Self Care | Payer: BLUE CROSS/BLUE SHIELD | Source: Ambulatory Visit | Attending: Radiation Oncology | Admitting: Radiation Oncology

## 2017-01-09 DIAGNOSIS — R928 Other abnormal and inconclusive findings on diagnostic imaging of breast: Secondary | ICD-10-CM

## 2019-04-08 IMAGING — MG STEREOTACTIC CORE NEEDLE BIOPSY
6 series · 6 of 6 positions shown · non-contrast
Comparison: Previous exams.

ADDENDUM:
Pathology revealed FIBROCYSTIC CHANGES WITH SCLEROSING ADENOSIS,
LOBULAR NEOPLASIA (FOCAL ATYPICAL LOBULAR HYPERPLASIA) of the Left
breast, lower outer quadrant. This was found to be concordant by Dr.
Jakobin Punmira, with excision recommenced. Pathology results were
discussed with the patient by telephone. The patient reported doing
well after the biopsy with tenderness at the site. Post biopsy
instructions and care were reviewed and questions were answered. The
patient was encouraged to call [REDACTED] for any additional concerns. Bern Kg, Nurse Navigator
in [HOSPITAL][HOSPITAL] will arrange a surgical referral per patient
request. Imaging and pathology reports were faxed to Bern Kg
on January 10, 2017.

Pathology results reported by Carlos Nery Quaglia, RN on 01/13/2017.
CLINICAL DATA: Possible left breast distortion
EXAM:
LEFT BREAST STEREOTACTIC CORE NEEDLE BIOPSY

[L (1 of 6)]
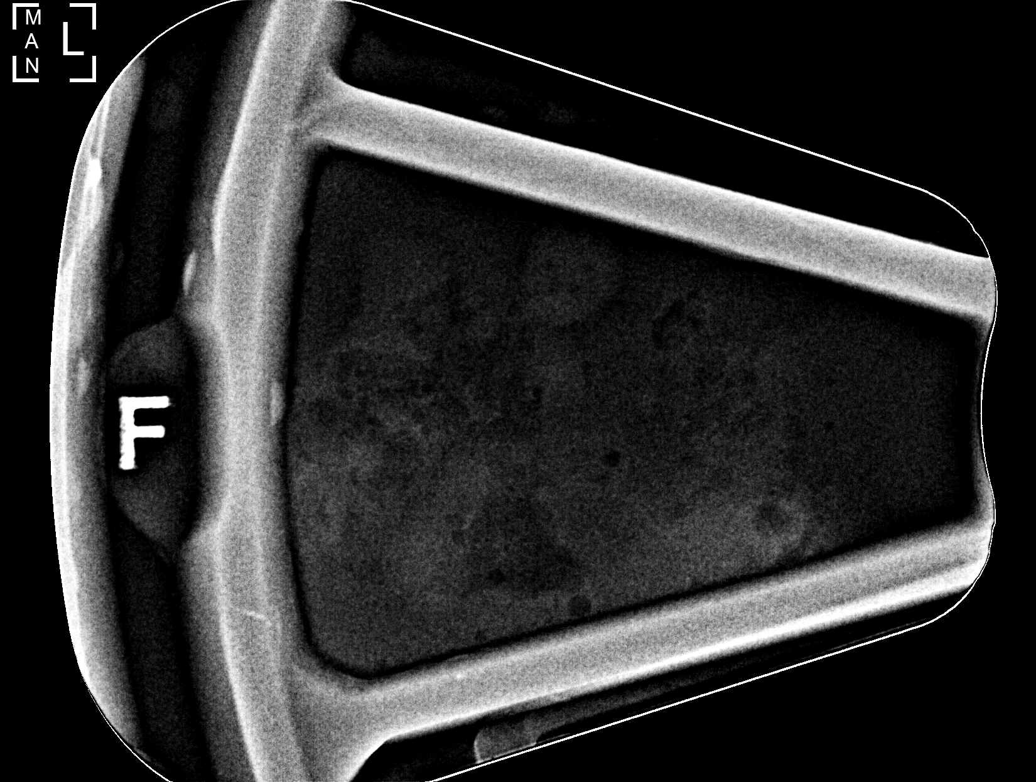

[L (2 of 6)]
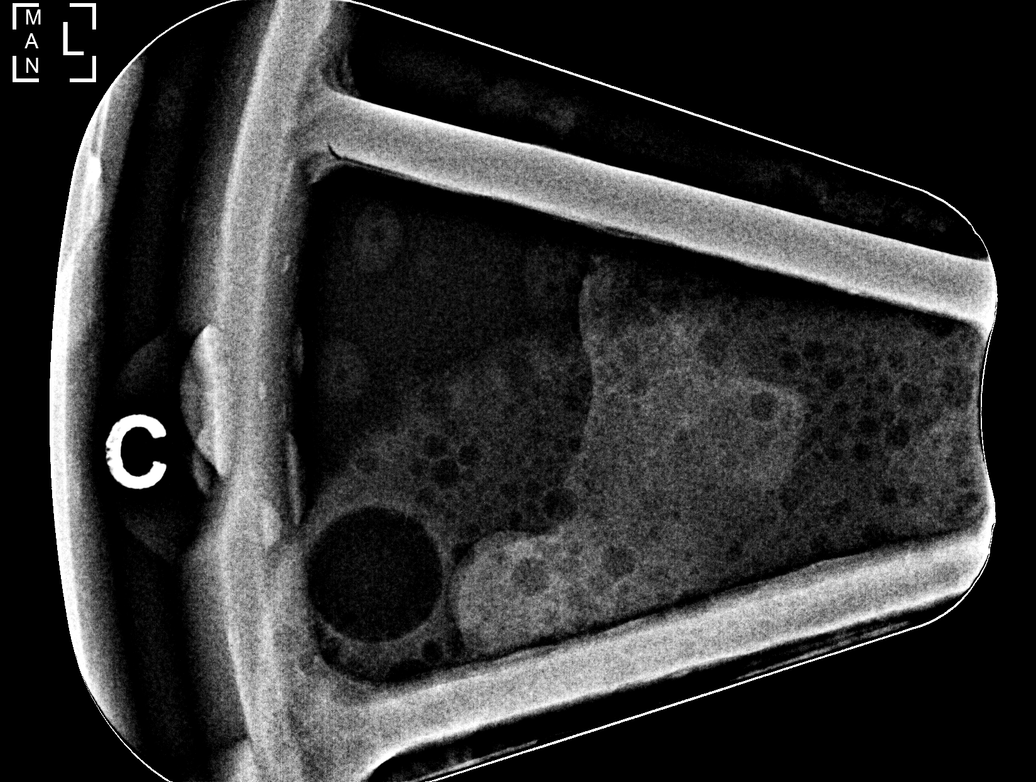

[L (3 of 6)]
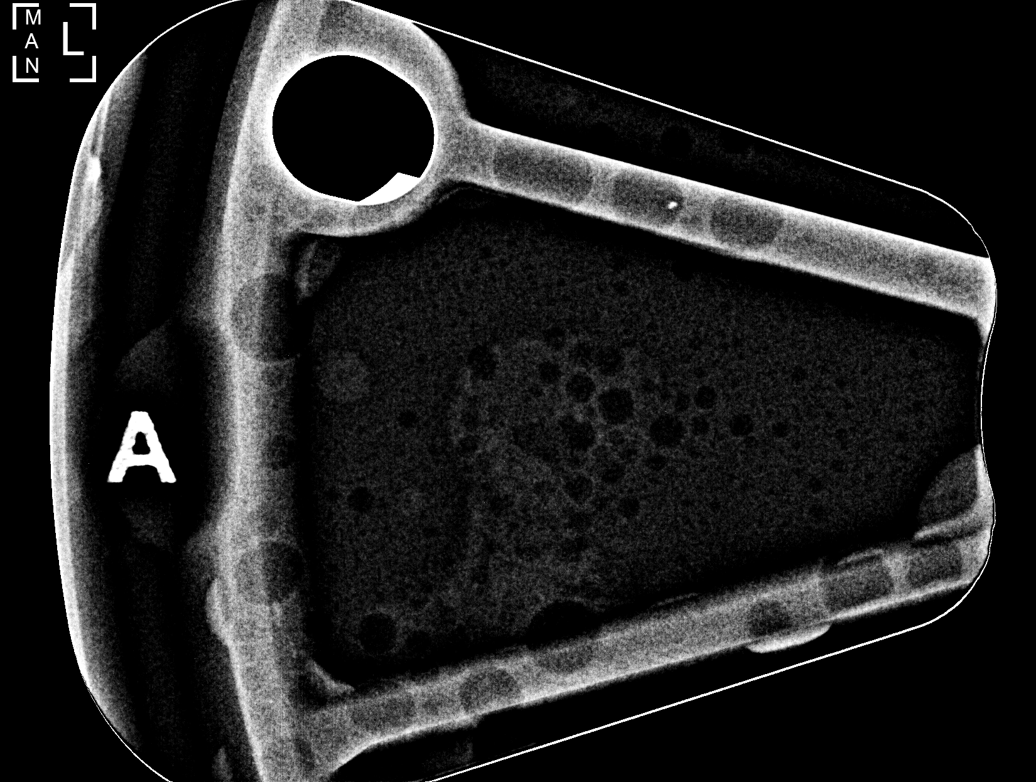

[L (4 of 6)]
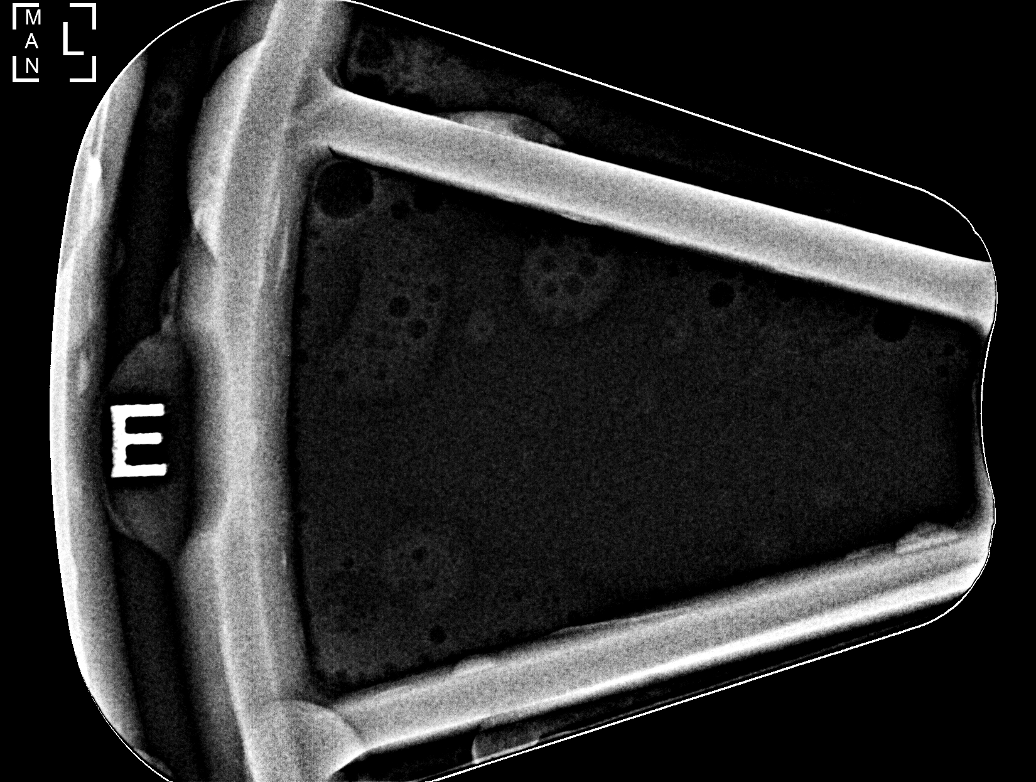

[L (5 of 6)]
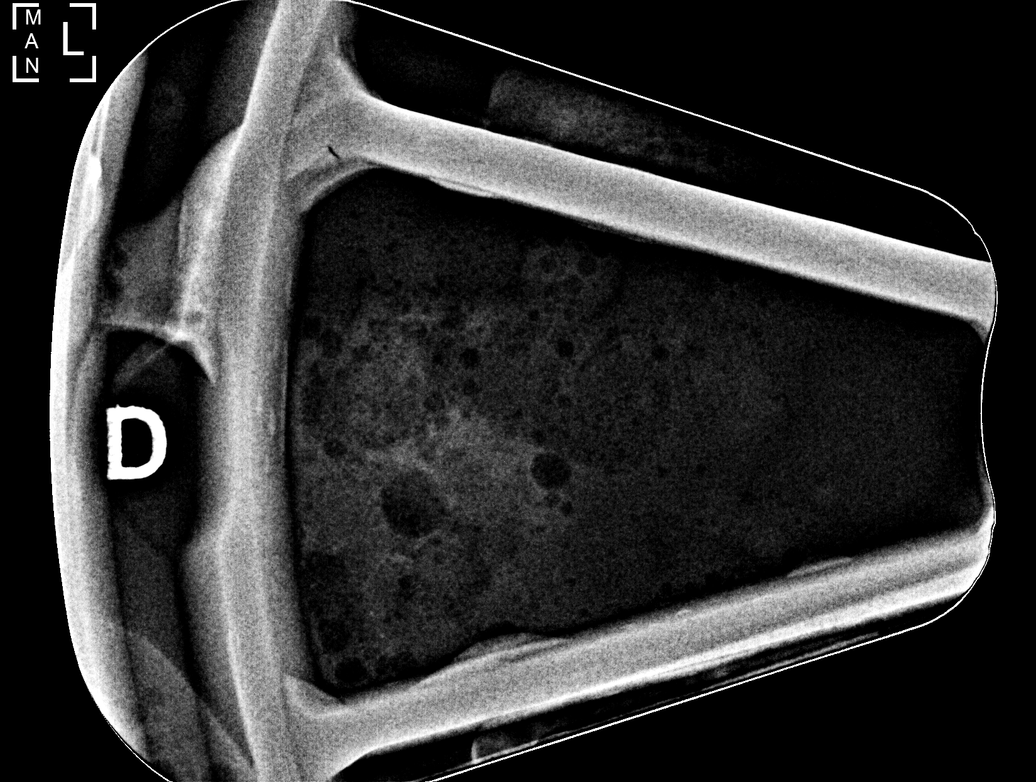

[L (6 of 6)]
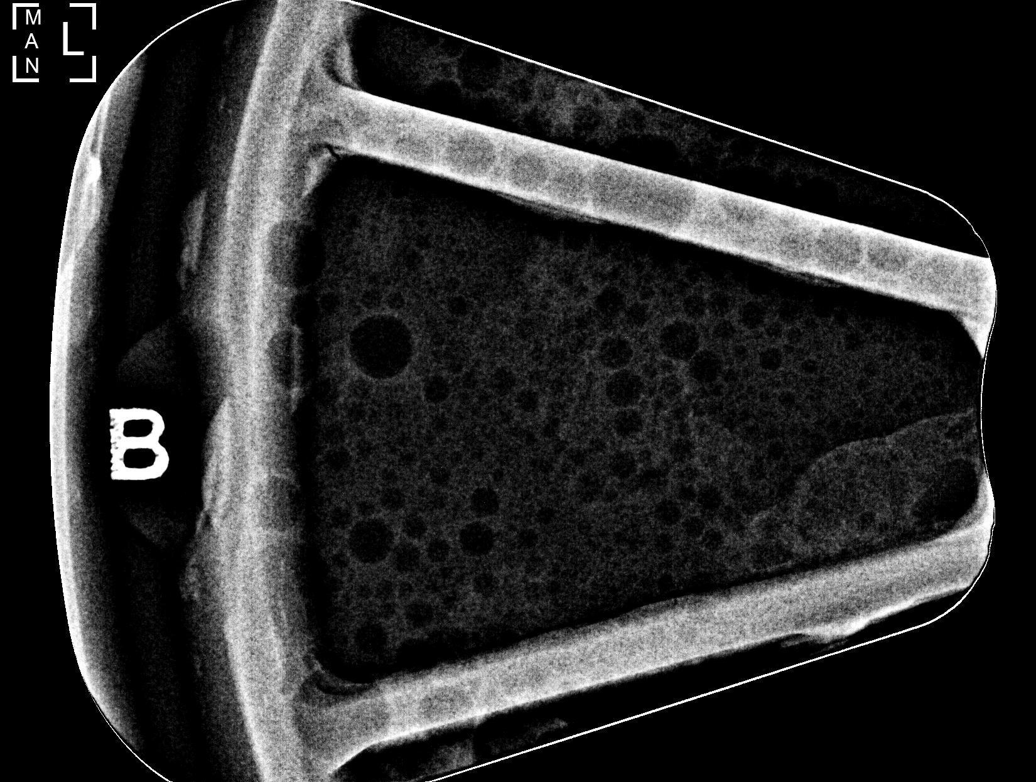

[6 of 6 positions shown; findings below may reference images not displayed]



Using sterile technique and 1% Lidocaine as local anesthetic, under
stereotactic guidance, a 9 gauge vacuum assisted device was used to
perform core needle biopsy of possible distortion in the lateral
inferior left breast using a superior approach.

Lesion quadrant: Lower outer left breast

At the conclusion of the procedure, a coil shaped tissue marker clip
was deployed into the biopsy cavity. Follow-up 2-view mammogram was
performed and dictated separately.
IMPRESSION: Stereotactic-guided biopsy of possible left breast distortion. No
apparent complications.

## 2022-08-07 ENCOUNTER — Other Ambulatory Visit: Payer: Self-pay

## 2022-08-07 DIAGNOSIS — E7800 Pure hypercholesterolemia, unspecified: Secondary | ICD-10-CM

## 2022-08-20 ENCOUNTER — Ambulatory Visit (HOSPITAL_BASED_OUTPATIENT_CLINIC_OR_DEPARTMENT_OTHER)
Admission: RE | Admit: 2022-08-20 | Discharge: 2022-08-20 | Disposition: A | Discharge status: Home / Self Care | Payer: Self-pay | Source: Ambulatory Visit | Attending: Internal Medicine | Admitting: Internal Medicine

## 2022-08-20 DIAGNOSIS — E7800 Pure hypercholesterolemia, unspecified: Secondary | ICD-10-CM | POA: Insufficient documentation
